# Patient Record
Sex: Female | Born: 1960 | Race: White | Hispanic: No | Marital: Married | Smoking: Never smoker
Health system: Southern US, Community
[De-identification: ages and names within clinical notes are randomized; demographics above are authoritative.]

## PROBLEM LIST (undated history)

## (undated) DIAGNOSIS — K219 Gastro-esophageal reflux disease without esophagitis: Secondary | ICD-10-CM

## (undated) HISTORY — PX: CHOLECYSTECTOMY: SHX55

---

## 2019-12-01 ENCOUNTER — Emergency Department (HOSPITAL_COMMUNITY): Payer: 59

## 2019-12-01 ENCOUNTER — Other Ambulatory Visit: Payer: Self-pay

## 2019-12-01 ENCOUNTER — Encounter (HOSPITAL_COMMUNITY): Payer: Self-pay

## 2019-12-01 ENCOUNTER — Emergency Department (HOSPITAL_COMMUNITY)
Admission: EM | Admit: 2019-12-01 | Discharge: 2019-12-01 | Disposition: A | Discharge status: Home / Self Care | Payer: 59 | Attending: Emergency Medicine | Admitting: Emergency Medicine

## 2019-12-01 DIAGNOSIS — Z79899 Other long term (current) drug therapy: Secondary | ICD-10-CM | POA: Diagnosis not present

## 2019-12-01 DIAGNOSIS — R079 Chest pain, unspecified: Secondary | ICD-10-CM

## 2019-12-01 DIAGNOSIS — R0789 Other chest pain: Secondary | ICD-10-CM | POA: Diagnosis present

## 2019-12-01 LAB — COMPREHENSIVE METABOLIC PANEL
ALT: 293 U/L — ABNORMAL HIGH (ref 0–44)
AST: 305 U/L — ABNORMAL HIGH (ref 15–41)
Albumin: 3.8 g/dL (ref 3.5–5.0)
Alkaline Phosphatase: 97 U/L (ref 38–126)
Anion gap: 9 (ref 5–15)
BUN: 9 mg/dL (ref 6–20)
CO2: 27 mmol/L (ref 22–32)
Calcium: 10.5 mg/dL — ABNORMAL HIGH (ref 8.9–10.3)
Chloride: 101 mmol/L (ref 98–111)
Creatinine, Ser: 0.92 mg/dL (ref 0.44–1.00)
GFR calc Af Amer: >60 mL/min (ref >60)
GFR calc non Af Amer: >60 mL/min (ref >60)
Glucose, Bld: 105 mg/dL — ABNORMAL HIGH (ref 70–99)
Potassium: 3.8 mmol/L (ref 3.5–5.1)
Sodium: 137 mmol/L (ref 135–145)
Total Bilirubin: 1.7 mg/dL — ABNORMAL HIGH (ref 0.3–1.2)
Total Protein: 6.9 g/dL (ref 6.5–8.1)

## 2019-12-01 LAB — CBC
HCT: 44.1 % (ref 36.0–46.0)
Hemoglobin: 14.5 g/dL (ref 12.0–15.0)
MCH: 32.7 pg (ref 26.0–34.0)
MCHC: 32.9 g/dL (ref 30.0–36.0)
MCV: 99.3 fL (ref 80.0–100.0)
Platelets: 242 10*3/uL (ref 150–400)
RBC: 4.44 MIL/uL (ref 3.87–5.11)
RDW: 12.2 % (ref 11.5–15.5)
WBC: 8.4 10*3/uL (ref 4.0–10.5)
nRBC: 0 % (ref 0.0–0.2)

## 2019-12-01 LAB — TROPONIN I (HIGH SENSITIVITY)
Troponin I (High Sensitivity): 6 ng/L (ref <18)
Troponin I (High Sensitivity): 6 ng/L (ref <18)

## 2019-12-01 LAB — LIPASE, BLOOD: Lipase: 36 U/L (ref 11–51)

## 2019-12-01 MED ORDER — ALUM & MAG HYDROXIDE-SIMETH 200-200-20 MG/5ML PO SUSP
30.0000 mL | Freq: Once | ORAL | Status: AC
  Administered 2019-12-01: 30 mL via ORAL
  Filled 2019-12-01: qty 30

## 2019-12-01 NOTE — ED Triage Notes (Signed)
Pt brought to ED via EMS from work with c/o central chest pain that woke her from sleep around midnight. Pt reports taking tums and pain eased. Pain returned today while at work. EMS admin 324 mg aspirin and 1 SL nitro for pain rated 2/10, pain unchanged. Pt reports she was dx with a sinus infection yesterday and is currently on PO abx. Currently A&O x4. Denies n/v, dizziness.

## 2019-12-01 NOTE — ED Notes (Signed)
Patient verbalizes understanding of discharge instructions. Opportunity for questioning and answers were provided. Armband removed by staff, pt discharged from ED ambulatory to home.  

## 2019-12-01 NOTE — ED Notes (Signed)
Pt transported to xray 

## 2019-12-01 NOTE — ED Provider Notes (Signed)
[facility] EMERGENCY DEPARTMENT Provider Note   CSN: [id number] Arrival date & time: 12/01/19  1143     History Chief Complaint  Patient presents with  . Chest Pain    Jean Bennett is a 59 y.o. female with PMH cholecystectomy presents the ED via EMS for central chest pain described as a "pressure" that radiated to her back that began at 10 AM.  Patient reports that she was at work and had been ambulating up and down the stairs before sitting at her desk prior to onset of symptoms.   Her chest pain lasted approximately 30 to 40 minutes before seemingly resolving.  Obtained history from EMS who states that her chest pain was 2 out of 10 on arrival and she was provided 1 nitroglycerin and 324 aspirin without improvement.  She reports that this had happened last evening at 12 AM with similar duration that woke her from her sleep and and was improved with Tums.  This episode also occurred 4 days ago, again at night.  Patient is suspecting that this is simply indigestion, however wanted to be evaluated to err on the side of caution.  She endorses some mild associated shortness of breath symptoms.  She denies any lightheadedness, fevers or chills, nausea or vomiting, syncopal episodes, neurologic deficits, palpitations, urinary symptoms, or changes in bowel habits.  She has recently been treated for a URI with antibiotics.  No pleuritic chest pain or pleuritic cough.  She is prescribed hormone replacement therapy for hot flashes, however denies any history of clots, leg swelling, clotting disorder, recent immobilization or surgery, or any other risk factors for PE.  HPI  HPI: A 59 year old patient presents for evaluation of chest pain. Initial onset of pain was approximately 3-6 hours ago. The patient's chest pain is described as heaviness/pressure/tightness and is not worse with exertion. The patient's chest pain is middle- or left-sided, is not well-localized, is not sharp and does not  radiate to the arms/jaw/neck. The patient does not complain of nausea and denies diaphoresis. The patient has no history of stroke, has no history of peripheral artery disease, has not smoked in the past 90 days, denies any history of treated diabetes, has no relevant family history of coronary artery disease (first degree relative at less than age 66), is not hypertensive, has no history of hypercholesterolemia and does not have an elevated BMI (>=30).   History reviewed. No pertinent past medical history.  There are no problems to display for this patient.   Past Surgical History:  Procedure Laterality Date  . CHOLECYSTECTOMY       OB History   No obstetric history on file.     No family history on file.  Social History   Tobacco Use  . Smoking status: Never Smoker  . Smokeless tobacco: Never Used  Substance Use Topics  . Alcohol use: Yes    Alcohol/week: 5.0 standard drinks    Types: 5 Glasses of wine per week  . Drug use: Never    Home Medications Prior to Admission medications   Medication Sig Start Date End Date Taking? Authorizing Provider  albuterol (VENTOLIN HFA) 108 (90 Base) MCG/ACT inhaler Inhale 2 puffs into the lungs every 6 (six) hours as needed for wheezing or shortness of breath.   Yes [provider]  azithromycin (ZITHROMAX) 250 MG tablet Take 250 mg by mouth daily.   Yes [provider]  progesterone (PROMETRIUM) 100 MG capsule Take 100 mg by mouth at bedtime.  10/21/19  Yes [provider]  promethazine-dextromethorphan (PROMETHAZINE-DM) 6.25-15 MG/5ML syrup Take 5 mLs by mouth 4 (four) times daily as needed for cough.   Yes [provider]    Allergies    Patient has no allergy information on record.  Review of Systems   Review of Systems  All other systems reviewed and are negative.   Physical Exam Updated Vital Signs BP 129/78   Pulse 87   Temp 98 F (36.7 C) (Oral)   Resp 20   Ht 5\' 1"  (1.549 m)   Wt  77.1 kg   SpO2 94%   BMI 32.12 kg/m   Physical Exam Vitals and nursing note reviewed. Exam conducted with a chaperone present.  Constitutional:      Appearance: Normal appearance.  HENT:     Head: Normocephalic and atraumatic.  Eyes:     General: No scleral icterus.    Conjunctiva/sclera: Conjunctivae normal.  Cardiovascular:     Rate and Rhythm: Normal rate and regular rhythm.     Pulses: Normal pulses.     Heart sounds: Normal heart sounds.  Pulmonary:     Effort: Pulmonary effort is normal. No respiratory distress.     Breath sounds: Normal breath sounds. No wheezing or rales.  Musculoskeletal:     Cervical back: Normal range of motion and neck supple. No rigidity.  Skin:    General: Skin is dry.     Capillary Refill: Capillary refill takes less than 2 seconds.  Neurological:     Mental Status: She is alert and oriented to person, place, and time.     GCS: GCS eye subscore is 4. GCS verbal subscore is 5. GCS motor subscore is 6.  Psychiatric:        Mood and Affect: Mood normal.        Behavior: Behavior normal.        Thought Content: Thought content normal.     ED Results / Procedures / Treatments   Labs (all labs ordered are listed, but only abnormal results are displayed) Labs Reviewed  COMPREHENSIVE METABOLIC PANEL - Abnormal; Notable for the following components:      Result Value   Glucose, Bld 105 (*)    Calcium 10.5 (*)    AST 305 (*)    ALT 293 (*)    Total Bilirubin 1.7 (*)    All other components within normal limits  LIPASE, BLOOD  CBC  TROPONIN I (HIGH SENSITIVITY)  TROPONIN I (HIGH SENSITIVITY)    EKG EKG Interpretation  Date/Time:  Tuesday December 01 2019 11:56:23 EST Ventricular Rate:  91 PR Interval:    QRS Duration: 96 QT Interval:  339 QTC Calculation: 417 R Axis:   64 Text Interpretation: Sinus rhythm Left atrial enlargement Confirmed by 10-27-2000 [id number]) on 12/01/2019 12:24:49 PM   Radiology DG Chest 2 View  Result Date:  12/01/2019 CLINICAL DATA:  Chest pain since Saturday, has awakened patient from sleep 3 nights in a row EXAM: CHEST - 2 VIEW COMPARISON:  11/06/2016 FINDINGS: Normal heart size, mediastinal contours, and pulmonary vascularity. Lungs clear. No pulmonary infiltrate, pleural effusion or pneumothorax. Osseous structures unremarkable. IMPRESSION: No acute abnormalities. Electronically Signed   By: 01/04/2017 M.D.   On: 12/01/2019 12:20    Procedures Procedures (including critical care time)  Medications Ordered in ED Medications  alum & mag hydroxide-simeth (MAALOX/MYLANTA) 200-200-20 MG/5ML suspension 30 mL (30 mLs Oral Given 12/01/19 1315)    ED Course  I have  reviewed the triage vital signs and the nursing notes.  Pertinent labs & imaging results that were available during my care of the patient were reviewed by me and considered in my medical decision making (see chart for details).    MDM Rules/Calculators/A&P HEAR Score: 2                    EKG demonstrates normal sinus rhythm and plain films obtained of chest demonstrate no acute normalities.  Lab work is entirely reassuring.  Her chest discomfort has resolved entirely.  While in the ED, she endorses 3 out of 10 burning chest discomfort.  Patient is treated with Maalox.  She (and her husband) believe that this is indigestion.  After receiving Maalox, she feels as though her chest pain had improved and is now a 1 out of 10.  While patient uses hormone replacement therapy, she scored 0.0 points in Wells criteria for pulmonary embolism I have low suspicion for PE at this time.  Discussed with patient and she agrees to not evaluate with CTA.  She feels much improved and is prepared for discharge as soon as we are finished with our work-up.  Per heart pathway, will obtain second troponin prior to discharge.  Second troponin returned without change.  She was found to have marked transaminitis with AST of 305 and ALT of 293 as well as a total  bilirubin of 1.7.  She is unsure whether or not this is an acute or chronic finding.  Repeat abdominal exams did not reveal any tenderness in the right upper quadrant.  Do not feel as though this is related and instead believe that it can be worked up further outpatient.  The rest of her lab work was unremarkable.  Patient is safe for discharge at this time.  All of the evaluation and work-up results were discussed with the patient and any family at bedside. They were provided opportunity to ask any additional questions and have none at this time. They have expressed understanding of verbal discharge instructions as well as return precautions and are agreeable to the plan.    Final Clinical Impression(s) / ED Diagnoses Final diagnoses:  Nonspecific chest pain    Rx / DC Orders ED Discharge Orders    None       Corena Herter, PA-C 12/01/19 1511    Virgel Manifold, MD 12/02/19 [phone]

## 2019-12-01 NOTE — Discharge Instructions (Signed)
Please follow-up with your primary care provider regarding today's encounter and to further discuss your elevated liver enzymes.    Please return to the ED or seek immediate medical attention should develop any new or worsening symptoms.

## 2019-12-04 ENCOUNTER — Telehealth: Payer: Self-pay | Admitting: *Deleted

## 2019-12-04 NOTE — Telephone Encounter (Signed)
Pt called regarding [facility] not receiving referral from ED.  RNCM faxed encounter information to [phone].

## 2021-09-12 ENCOUNTER — Other Ambulatory Visit: Payer: Self-pay

## 2021-09-12 ENCOUNTER — Inpatient Hospital Stay (HOSPITAL_COMMUNITY)
Admission: EM | Admit: 2021-09-12 | Discharge: 2021-09-16 | DRG: 446 | Disposition: A | Discharge status: Home / Self Care | Payer: 59 | Attending: Internal Medicine | Admitting: Internal Medicine

## 2021-09-12 DIAGNOSIS — K8031 Calculus of bile duct with cholangitis, unspecified, with obstruction: Principal | ICD-10-CM | POA: Diagnosis present

## 2021-09-12 DIAGNOSIS — Z419 Encounter for procedure for purposes other than remedying health state, unspecified: Secondary | ICD-10-CM

## 2021-09-12 DIAGNOSIS — K8051 Calculus of bile duct without cholangitis or cholecystitis with obstruction: Secondary | ICD-10-CM

## 2021-09-12 DIAGNOSIS — B9689 Other specified bacterial agents as the cause of diseases classified elsewhere: Secondary | ICD-10-CM

## 2021-09-12 DIAGNOSIS — K8309 Other cholangitis: Secondary | ICD-10-CM

## 2021-09-12 DIAGNOSIS — E669 Obesity, unspecified: Secondary | ICD-10-CM | POA: Diagnosis present

## 2021-09-12 DIAGNOSIS — R739 Hyperglycemia, unspecified: Secondary | ICD-10-CM | POA: Diagnosis not present

## 2021-09-12 DIAGNOSIS — F1010 Alcohol abuse, uncomplicated: Secondary | ICD-10-CM | POA: Diagnosis present

## 2021-09-12 DIAGNOSIS — Z6831 Body mass index (BMI) 31.0-31.9, adult: Secondary | ICD-10-CM

## 2021-09-12 HISTORY — DX: Gastro-esophageal reflux disease without esophagitis: K21.9

## 2021-09-13 ENCOUNTER — Emergency Department (HOSPITAL_COMMUNITY): Payer: 59

## 2021-09-13 ENCOUNTER — Other Ambulatory Visit: Payer: Self-pay

## 2021-09-13 ENCOUNTER — Encounter (HOSPITAL_COMMUNITY): Payer: Self-pay

## 2021-09-13 DIAGNOSIS — K831 Obstruction of bile duct: Secondary | ICD-10-CM | POA: Insufficient documentation

## 2021-09-13 DIAGNOSIS — F1010 Alcohol abuse, uncomplicated: Secondary | ICD-10-CM | POA: Diagnosis present

## 2021-09-13 DIAGNOSIS — R933 Abnormal findings on diagnostic imaging of other parts of digestive tract: Secondary | ICD-10-CM | POA: Diagnosis not present

## 2021-09-13 DIAGNOSIS — K8050 Calculus of bile duct without cholangitis or cholecystitis without obstruction: Secondary | ICD-10-CM | POA: Diagnosis not present

## 2021-09-13 DIAGNOSIS — K8309 Other cholangitis: Secondary | ICD-10-CM

## 2021-09-13 DIAGNOSIS — Z6831 Body mass index (BMI) 31.0-31.9, adult: Secondary | ICD-10-CM | POA: Diagnosis not present

## 2021-09-13 DIAGNOSIS — E669 Obesity, unspecified: Secondary | ICD-10-CM | POA: Diagnosis present

## 2021-09-13 DIAGNOSIS — R739 Hyperglycemia, unspecified: Secondary | ICD-10-CM | POA: Diagnosis not present

## 2021-09-13 DIAGNOSIS — K8031 Calculus of bile duct with cholangitis, unspecified, with obstruction: Secondary | ICD-10-CM | POA: Diagnosis present

## 2021-09-13 DIAGNOSIS — B9689 Other specified bacterial agents as the cause of diseases classified elsewhere: Secondary | ICD-10-CM

## 2021-09-13 LAB — CBC WITH DIFFERENTIAL/PLATELET
Abs Immature Granulocytes: 0.03 10*3/uL (ref 0.00–0.07)
Basophils Absolute: 0 10*3/uL (ref 0.0–0.1)
Basophils Relative: 0 %
Eosinophils Absolute: 0.1 10*3/uL (ref 0.0–0.5)
Eosinophils Relative: 1 %
HCT: 41.4 % (ref 36.0–46.0)
Hemoglobin: 13.9 g/dL (ref 12.0–15.0)
Immature Granulocytes: 0 %
Lymphocytes Relative: 7 %
Lymphs Abs: 0.8 10*3/uL (ref 0.7–4.0)
MCH: 32.3 pg (ref 26.0–34.0)
MCHC: 33.6 g/dL (ref 30.0–36.0)
MCV: 96.1 fL (ref 80.0–100.0)
Monocytes Absolute: 0.5 10*3/uL (ref 0.1–1.0)
Monocytes Relative: 5 %
Neutro Abs: 10.4 10*3/uL — ABNORMAL HIGH (ref 1.7–7.7)
Neutrophils Relative %: 87 %
Platelets: 273 10*3/uL (ref 150–400)
RBC: 4.31 MIL/uL (ref 3.87–5.11)
RDW: 12.1 % (ref 11.5–15.5)
WBC: 11.8 10*3/uL — ABNORMAL HIGH (ref 4.0–10.5)
nRBC: 0 % (ref 0.0–0.2)

## 2021-09-13 LAB — COMPREHENSIVE METABOLIC PANEL
ALT: 291 U/L — ABNORMAL HIGH (ref 0–44)
AST: 190 U/L — ABNORMAL HIGH (ref 15–41)
Albumin: 3.6 g/dL (ref 3.5–5.0)
Alkaline Phosphatase: 344 U/L — ABNORMAL HIGH (ref 38–126)
Anion gap: 8 (ref 5–15)
BUN: 10 mg/dL (ref 6–20)
CO2: 27 mmol/L (ref 22–32)
Calcium: 9 mg/dL (ref 8.9–10.3)
Chloride: 100 mmol/L (ref 98–111)
Creatinine, Ser: 1.04 mg/dL — ABNORMAL HIGH (ref 0.44–1.00)
GFR, Estimated: >60 mL/min (ref >60)
Glucose, Bld: 212 mg/dL — ABNORMAL HIGH (ref 70–99)
Potassium: 3.7 mmol/L (ref 3.5–5.1)
Sodium: 135 mmol/L (ref 135–145)
Total Bilirubin: 1.9 mg/dL — ABNORMAL HIGH (ref 0.3–1.2)
Total Protein: 7.4 g/dL (ref 6.5–8.1)

## 2021-09-13 LAB — TROPONIN I (HIGH SENSITIVITY)
Troponin I (High Sensitivity): 9 ng/L (ref <18)
Troponin I (High Sensitivity): 10 ng/L (ref <18)

## 2021-09-13 LAB — LIPASE, BLOOD: Lipase: 55 U/L — ABNORMAL HIGH (ref 11–51)

## 2021-09-13 MED ORDER — KETOROLAC TROMETHAMINE 30 MG/ML IJ SOLN: 30.0000 mg | Freq: Four times a day (QID) | INTRAMUSCULAR | Status: DC | PRN

## 2021-09-13 MED ORDER — THIAMINE HCL 100 MG PO TABS
100.0000 mg | ORAL_TABLET | Freq: Every day | ORAL | Status: DC
  Administered 2021-09-13 – 2021-09-14 (×2): 100 mg via ORAL
  Filled 2021-09-13 (×2): qty 1

## 2021-09-13 MED ORDER — FOLIC ACID 1 MG PO TABS
1.0000 mg | ORAL_TABLET | Freq: Every day | ORAL | Status: DC
  Administered 2021-09-13 – 2021-09-16 (×3): 1 mg via ORAL
  Filled 2021-09-13 (×3): qty 1

## 2021-09-13 MED ORDER — THIAMINE HCL 100 MG/ML IJ SOLN
100.0000 mg | Freq: Every day | INTRAMUSCULAR | Status: DC
  Administered 2021-09-16: 100 mg via INTRAVENOUS
  Filled 2021-09-13: qty 2

## 2021-09-13 MED ORDER — PANTOPRAZOLE SODIUM 40 MG PO TBEC
40.0000 mg | DELAYED_RELEASE_TABLET | Freq: Every day | ORAL | Status: DC
  Administered 2021-09-13 – 2021-09-16 (×3): 40 mg via ORAL
  Filled 2021-09-13 (×3): qty 1

## 2021-09-13 MED ORDER — ONDANSETRON HCL 4 MG PO TABS: 4.0000 mg | ORAL_TABLET | Freq: Four times a day (QID) | ORAL | Status: DC | PRN

## 2021-09-13 MED ORDER — INDOMETHACIN 50 MG RE SUPP
100.0000 mg | Freq: Once | RECTAL | Status: DC
  Filled 2021-09-13: qty 2

## 2021-09-13 MED ORDER — ACETAMINOPHEN 650 MG RE SUPP: 650.0000 mg | Freq: Four times a day (QID) | RECTAL | Status: DC | PRN

## 2021-09-13 MED ORDER — ADULT MULTIVITAMIN W/MINERALS CH
1.0000 | ORAL_TABLET | Freq: Every day | ORAL | Status: DC
  Administered 2021-09-13 – 2021-09-16 (×3): 1 via ORAL
  Filled 2021-09-13 (×3): qty 1

## 2021-09-13 MED ORDER — LORAZEPAM 1 MG PO TABS: 1.0000 mg | ORAL_TABLET | ORAL | Status: DC | PRN

## 2021-09-13 MED ORDER — ACETAMINOPHEN 325 MG PO TABS
650.0000 mg | ORAL_TABLET | Freq: Once | ORAL | Status: AC
  Administered 2021-09-13: 20:00:00 650 mg via ORAL
  Filled 2021-09-13: qty 2

## 2021-09-13 MED ORDER — PIPERACILLIN-TAZOBACTAM 3.375 G IVPB
3.3750 g | Freq: Three times a day (TID) | INTRAVENOUS | Status: DC
  Administered 2021-09-13 – 2021-09-16 (×9): 3.375 g via INTRAVENOUS
  Filled 2021-09-13 (×9): qty 50

## 2021-09-13 MED ORDER — ONDANSETRON HCL 4 MG/2ML IJ SOLN: 4.0000 mg | Freq: Four times a day (QID) | INTRAMUSCULAR | Status: DC | PRN

## 2021-09-13 MED ORDER — ACETAMINOPHEN 325 MG PO TABS
650.0000 mg | ORAL_TABLET | Freq: Four times a day (QID) | ORAL | Status: DC | PRN
  Administered 2021-09-14 (×2): 650 mg via ORAL
  Filled 2021-09-13 (×2): qty 2

## 2021-09-13 MED ORDER — LORAZEPAM 2 MG/ML IJ SOLN: 1.0000 mg | INTRAMUSCULAR | Status: DC | PRN

## 2021-09-13 MED ORDER — IOHEXOL 350 MG/ML SOLN
100.0000 mL | Freq: Once | INTRAVENOUS | Status: AC | PRN
  Administered 2021-09-13: 02:00:00 100 mL via INTRAVENOUS

## 2021-09-13 NOTE — ED Provider Notes (Signed)
[place] EMERGENCY DEPARTMENT Provider Note   CSN: [id number] Arrival date & time: 09/12/21  2346     History Chief Complaint  Patient presents with   Back Pain   Chest Pain   Abdominal Pain    Jean Bennett is a 60 y.o. female with PMH previous cholecystectomy who presents to the emergency department for evaluation of abdominal pain.  Patient states that since October 2022 she has had at least 10 episodes of acute onset right upper quadrant and epigastric pain that radiates to the back that caused her to "double over".  They are associated with nausea and vomiting and they ultimately subside.  She had an episode yesterday which prompted her to come to the emergency department last night.  Denies associated shortness of breath, diarrhea, hematochezia, chest pain with these episodes.  In the lobby there was concern about acute aortic dissection and a CT dissection study was ordered at that reassuringly did not show an aortic dissection but did show fairly severe common bile duct dilatation.  On my initial evaluation, patient symptoms have subsided.   Back Pain Associated symptoms: abdominal pain   Associated symptoms: no chest pain, no dysuria and no fever   Chest Pain Associated symptoms: abdominal pain, back pain, nausea and vomiting   Associated symptoms: no cough, no fever, no palpitations and no shortness of breath   Abdominal Pain Associated symptoms: nausea and vomiting   Associated symptoms: no chest pain, no chills, no cough, no dysuria, no fever, no hematuria, no shortness of breath and no sore throat       History reviewed. No pertinent past medical history.  There are no problems to display for this patient.   Past Surgical History:  Procedure Laterality Date   CHOLECYSTECTOMY       OB History   No obstetric history on file.     No family history on file.  Social History   Tobacco Use   Smoking status: Never   Smokeless tobacco: Never   Vaping Use   Vaping Use: Never used  Substance Use Topics   Alcohol use: Yes    Alcohol/week: 5.0 standard drinks    Types: 5 Glasses of wine per week   Drug use: Never    Home Medications Prior to Admission medications   Medication Sig Start Date End Date Taking? Authorizing Provider  Cholecalciferol (VITAMIN D3 PO) Take 1 capsule by mouth daily.   Yes [provider]  Cyanocobalamin (VITAMIN B-12 PO) Take 1 tablet by mouth daily.   Yes [provider]    Allergies    Patient has no known allergies.  Review of Systems   Review of Systems  Constitutional:  Negative for chills and fever.  HENT:  Negative for ear pain and sore throat.   Eyes:  Negative for pain and visual disturbance.  Respiratory:  Negative for cough and shortness of breath.   Cardiovascular:  Negative for chest pain and palpitations.  Gastrointestinal:  Positive for abdominal pain, nausea and vomiting.  Genitourinary:  Negative for dysuria and hematuria.  Musculoskeletal:  Positive for back pain. Negative for arthralgias.  Skin:  Negative for color change and rash.  Neurological:  Negative for seizures and syncope.  All other systems reviewed and are negative.  Physical Exam Updated Vital Signs BP 130/76    Pulse 95    Temp 98.3 F (36.8 C) (Oral)    Resp 18    Ht _0  (1.549 m)  Wt 76.2 kg    SpO2 97%    BMI 31.74 kg/m   Physical Exam Vitals and nursing note reviewed.  Constitutional:      General: She is not in acute distress.    Appearance: She is well-developed.  HENT:     Head: Normocephalic and atraumatic.  Eyes:     Conjunctiva/sclera: Conjunctivae normal.  Cardiovascular:     Rate and Rhythm: Normal rate and regular rhythm.     Heart sounds: No murmur heard. Pulmonary:     Effort: Pulmonary effort is normal. No respiratory distress.     Breath sounds: Normal breath sounds.  Abdominal:     Palpations: Abdomen is soft.     Tenderness: There is no abdominal  tenderness.  Musculoskeletal:        General: No swelling.     Cervical back: Neck supple.  Skin:    General: Skin is warm and dry.     Capillary Refill: Capillary refill takes less than 2 seconds.  Neurological:     Mental Status: She is alert.  Psychiatric:        Mood and Affect: Mood normal.    ED Results / Procedures / Treatments   Labs (all labs ordered are listed, but only abnormal results are displayed) Labs Reviewed  CBC WITH DIFFERENTIAL/PLATELET - Abnormal; Notable for the following components:      Result Value   WBC 11.8 (*)    Neutro Abs 10.4 (*)    All other components within normal limits  COMPREHENSIVE METABOLIC PANEL - Abnormal; Notable for the following components:   Glucose, Bld 212 (*)    Creatinine, Ser 1.04 (*)    AST 190 (*)    ALT 291 (*)    Alkaline Phosphatase 344 (*)    Total Bilirubin 1.9 (*)    All other components within normal limits  LIPASE, BLOOD - Abnormal; Notable for the following components:   Lipase 55 (*)    All other components within normal limits  TROPONIN I (HIGH SENSITIVITY)  TROPONIN I (HIGH SENSITIVITY)    EKG EKG Interpretation  Date/Time:  Tuesday September 12 2021 23:55:49 EST Ventricular Rate:  96 PR Interval:  134 QRS Duration: 88 QT Interval:  350 QTC Calculation: 442 R Axis:   93 Text Interpretation: Normal sinus rhythm Rightward axis Borderline ECG No significant change since last tracing Confirmed by Isla Pence [phone]) on 09/13/2021 1:25:14 PM  Radiology DG Chest 2 View  Result Date: 09/13/2021 CLINICAL DATA:  Chest pain EXAM: CHEST - 2 VIEW COMPARISON:  12/01/2019 FINDINGS: Lungs are clear.  No pleural effusion or pneumothorax. The heart is normal in size. Visualized osseous structures are within normal limits. Cholecystectomy clips. IMPRESSION: Normal chest radiographs. Electronically Signed   By: Julian Hy M.D.   On: 09/13/2021 01:05   CT Angio Chest/Abd/Pel for Dissection W and/or Wo  Contrast  Result Date: 09/13/2021 CLINICAL DATA:  Chest and back pain.  Concern for aortic dissection. EXAM: CT ANGIOGRAPHY CHEST, ABDOMEN AND PELVIS TECHNIQUE: Non-contrast CT of the chest was initially obtained. Multidetector CT imaging through the chest, abdomen and pelvis was performed using the standard protocol during bolus administration of intravenous contrast. Multiplanar reconstructed images and MIPs were obtained and reviewed to evaluate the vascular anatomy. CONTRAST:  166m OMNIPAQUE IOHEXOL 350 MG/ML SOLN COMPARISON:  CT of the abdomen pelvis dated 08/29/2013. Chest radiograph dated 09/13/2021. FINDINGS: CTA CHEST FINDINGS Cardiovascular: There is no cardiomegaly or pericardial effusion the thoracic aorta is unremarkable.  The origins of the great vessels of the aortic arch appear patent. Evaluation of the pulmonary arteries is limited due to respiratory motion artifact. No pulmonary artery embolus item Mediastinum/Nodes: No hilar or mediastinal adenopathy. The esophagus is grossly unremarkable. No mediastinal fluid collection. Lungs/Pleura: Minimal bibasilar atelectasis. No focal consolidation, pleural effusion, or pneumothorax. The central airways are patent. Musculoskeletal: Bilateral breast implants. No acute osseous pathology. Review of the MIP images confirms the above findings. CTA ABDOMEN AND PELVIS FINDINGS VASCULAR Aorta: Normal caliber aorta without aneurysm, dissection, vasculitis or significant stenosis. Celiac: Patent without evidence of aneurysm, dissection, vasculitis or significant stenosis. SMA: Patent without evidence of aneurysm, dissection, vasculitis or significant stenosis. Renals: Both renal arteries are patent without evidence of aneurysm, dissection, vasculitis, fibromuscular dysplasia or significant stenosis. IMA: Patent without evidence of aneurysm, dissection, vasculitis or significant stenosis. Inflow: Patent without evidence of aneurysm, dissection, vasculitis or  significant stenosis. Veins: No obvious venous abnormality within the limitations of this arterial phase study. Review of the MIP images confirms the above findings. NON-VASCULAR No intra-abdominal free air or free fluid. Hepatobiliary: Slight heterogeneous enhancement of the liver. There is a 2 cm left hepatic cyst as well as smaller additional cysts, present on the prior CT but slightly increased in size since 2014. There is dilatation of the common bile duct and intrahepatic biliary trees, likely post cholecystectomy. The common bile duct measures approximately 17 mm in diameter. No retained calcified stone noted in the central CBD. There is however faint soft tissue density in the region of the MP of (167/6 and coronal 70/9) which may represent ampullary tissue or debris. This can be better evaluated with MRI/MRCP, possibly on a nonemergent/outpatient basis. Pancreas: Unremarkable. No pancreatic ductal dilatation or surrounding inflammatory changes. Spleen: Normal in size without focal abnormality. Adrenals/Urinary Tract: The adrenal glands unremarkable. The kidneys, visualized ureters, and urinary bladder appear unremarkable. Stomach/Bowel: Small scattered sigmoid diverticula without active inflammatory changes. There is no bowel obstruction or active inflammation. The appendix is normal. Lymphatic: No adenopathy. Reproductive: The uterus and ovaries are grossly unremarkable. No adnexal masses. Other: None Musculoskeletal: No acute or significant osseous findings. Review of the MIP images confirms the above findings. IMPRESSION: 1. No acute intrathoracic, abdominal, or pelvic pathology. No aortic aneurysm or dissection. 2. Cholecystectomy with dilatation of the common bile duct and intrahepatic biliary trees. MRI/MRCP may provide better evaluation, possibly on a nonemergent/outpatient basis. 3. Small scattered sigmoid diverticula without active inflammatory changes. No bowel obstruction. Normal appendix.  Electronically Signed   By: Anner Crete M.D.   On: 09/13/2021 01:52   US Abdomen Limited RUQ (LIVER/GB)  Result Date: 09/13/2021 CLINICAL DATA:  Common bile duct dilatation on CT. EXAM: ULTRASOUND ABDOMEN LIMITED RIGHT UPPER QUADRANT COMPARISON:  CT 09/13/2021 FINDINGS: Gallbladder: Postcholecystectomy. Common bile duct: Diameter: Dilated 9 mm. Liver: Mild intrahepatic biliary duct dilatation similar to comparison CT. Several cystic lesions in liver. Mild increased liver echogenicity portal vein is patent on color Doppler imaging with normal direction of blood flow towards the liver. Other: None. IMPRESSION: 1. Dilatation of the common bile duct as seen on comparison CT. 2. Mild intrahepatic biliary duct dilatation seen on comparison CT. 3. Mild increased liver echogenicity. Electronically Signed   By: Suzy Bouchard M.D.   On: 09/13/2021 13:03    Procedures Procedures   Medications Ordered in ED Medications  piperacillin-tazobactam (ZOSYN) IVPB 3.375 g (3.375 g Intravenous New Bag/Given 09/13/21 1455)  iohexol (OMNIPAQUE) 350 MG/ML injection 100 mL (100 mLs Intravenous Contrast Given  09/13/21 0138)    ED Course  I have reviewed the triage vital signs and the nursing notes.  Pertinent labs & imaging results that were available during my care of the patient were reviewed by me and considered in my medical decision making (see chart for details).    MDM Rules/Calculators/A&P                           Patient seen the emergency department for evaluation of abdominal pain.  Physical exam reveals tenderness in the epigastrium and in the right upper quadrant but no abdominal guarding.  Patient not complaining of abdominal pain at rest.  Laboratory evaluation with an AST elevation to 190, ALT 291, alk phos elevated to 344 and total bili elevated at 1.9, leukocytosis to 11.8 concerning for biliary obstruction.  With the patient's lobby obtained CT dissection study showing a common bile duct  dilatation, concern for choledocholithiasis, possible impending cholangitis.  Right upper quadrant ultrasound showing biliary dilatation.  GI consulted and recommendations are currently pending.  Patient then signed out to oncoming provider.  Please see provider signout for continuation of work-up. Final Clinical Impression(s) / ED Diagnoses Final diagnoses:  Choledocholithiasis with obstruction    Rx / DC Orders ED Discharge Orders     None        Zigmond Trela, Debe Coder, MD 09/13/21 [phone]

## 2021-09-13 NOTE — Progress Notes (Signed)
Pharmacy Antibiotic Note  Jean Bennett is a 60 y.o. female admitted on 09/12/2021 with  IAI .  Pharmacy has been consulted for Zosyn dosing.  Plan: Zosyn 3.375g IV q8h (4 hour infusion). -Monitor renal function, clinical status, and antibiotic plan  Height: 5\' 1"  (154.9 cm) Weight: 76.2 kg (168 lb) IBW/kg (Calculated) : 47.8  Temp (24hrs), Avg:98.6 F (37 C), Min:98.3 F (36.8 C), Max:98.9 F (37.2 C)  Recent Labs  Lab 09/13/21 0012  WBC 11.8*  CREATININE 1.04*    Estimated Creatinine Clearance: 55.1 mL/min (A) (by C-G formula based on SCr of 1.04 mg/dL (H)).    No Known Allergies  Antimicrobials this admission: Zosyn 12/14 >>   Thank you for allowing pharmacy to be a part of this patients care.  1/15, PharmD, [facility] Emergency Medicine Clinical Pharmacist ED RPh Phone: [phone] Main RX: [phone]

## 2021-09-13 NOTE — ED Triage Notes (Signed)
Pt here for generalized pain that's been ongoing for the past 4 hours. Pt stated that she can't move and states that pain started around 1900. Pt states that she's been taking antacids and tums for pain relief. Pt thought she had indigestion hence her reasoning for taking the antacids for pain.

## 2021-09-13 NOTE — ED Provider Notes (Signed)
Emergency Medicine Provider Triage Evaluation Note  Jean Bennett , a 60 y.o. female  was evaluated in triage.  Pt complains of back pain which radiates into her chest and abdomen.  Began about 4 hours PTA.  Described as sharp, stabbing.  No history of AAA, dissection. Initially thought she was having reflux however symptoms have persisted and are severe. Took antacids without relief. She rates her pain a 10/10.  Did note some darker than normal stools.  No history of PE, DVT, anticoagulation  Review of Systems  Positive: Back pain, CP, ABD pain Negative:   Physical Exam  BP (!) 111/94    Pulse 99    Temp 98.5 F (36.9 C)    Resp 17    Ht 5\' 1"  (1.549 m)    Wt 76.2 kg    SpO2 100%    BMI 31.74 kg/m  Gen:   Awake, tossing in pain Resp:  Normal effort  ABD:  Severe abd tenderness throughout upper abdomen MSK:   Moves extremities without difficulty  Other:    Medical Decision Making  Medically screening exam initiated at 12:46 AM.  Appropriate orders placed.  Jean Bennett was informed that the remainder of the evaluation will be completed by another provider, this initial triage assessment does not replace that evaluation, and the importance of remaining in the ED until their evaluation is complete.  Back pain, chest pain, abdominal pain   Ajdin Macke A, PA-C 09/13/21 0046    09/15/21, MD 09/13/21 [phone]

## 2021-09-13 NOTE — Consult Note (Signed)
[facility] Consult: 1:26 PM 09/13/2021  LOS: 0 days    Referring Provider: Dr Cline Cools  Primary Care Physician:  Endocrinology, [facility]ologist:  [facility] in [place].      Reason for Consultation: Abdominal pain, elevated LFTs.   HPI: Jean Bennett is a 60 y.o. female.  PMH cholecystectomy ~age 63.  Within a month of the cholecystectomy she developed acute jaundice and underwent ERCP.  Breast implants.  Hysteroscopy, D&C.  Tinnitus.  Colonoscopy age 22, average risk study.  Due for repeat screening age 89.  Seen in ED 11/2019 for sub sternal chest pressure, radiating to back, elevated LFTs noted: t bili 1.7, alk phos 97, AST/ALT 305/293, no [place], CT or MRI imaging then.  Only listed meds are vitamin D and B12 (RXd for tinnitus).  In October 2022 pt had a spell of pain in epigastrium radiating to back.  It subsided within 24 hours and was not associated with nausea, vomiting or residual symptoms.  Starting this past Friday, 5 days ago, she developed intense pain in the epigastrium radiating to the right upper abdomen and into her back.  She tried taking Tums and "acid reducer" med to no avail.  No associated nausea, vomiting or altered bowel habits.  Appetite was good and she is tolerating food.  In the last few days she has had some chills but no sweats.  Came to the ED today for evaluation when she was not able to get an appointment with her PCP for several weeks.  T bili 1.9.  Alkaline phosphatase 344.  AST/ALT 190/291.  Lipase 55.  Calcium 9.  BUN/creatinine 10/1.0.  WBCs 11.8.  Hgb 13.9.  Platelets 273 CT angio chest/abdomen/pelvis: Slight heterogeneity of liver, 2 cm left hepatic cyst and smaller additional liver cysts, slightly increased compared to 2014.  CBD dilated at  17 mm there is a faint soft tissue density in the region of the MP but may represent ampullary tissue or debris.  Intrahepatic biliary tree also mildly dilated.  Scattered sigmoid tics.  Pancreas, pancreatic duct unremarkable. Abdominal ultrasound : CBD 9 mm.  Mild intrahepatic biliary ductal dilatation, mild increased liver echo.  Dopplers normal.  Zosyn initiated.     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  History reviewed. No pertinent past medical history.  Past Surgical History:  Procedure Laterality Date   CHOLECYSTECTOMY      Prior to Admission medications   Medication Sig Start Date End Date Taking? Authorizing Provider  Cholecalciferol (VITAMIN D3 PO) Take 1 capsule by mouth daily.   Yes [provider]  Cyanocobalamin (VITAMIN B-12 PO) Take 1 tablet by mouth daily.   Yes [provider]    Scheduled Meds:  Infusions:  PRN Meds:    Allergies as of 09/12/2021   (Not on File)    No family history on file.  Social History   Socioeconomic History   Marital status: Married    Spouse name: Not on file   Number  of children: Not on file   Years of education: Not on file   Highest education level: Not on file  Occupational History   Not on file  Tobacco Use   Smoking status: Never   Smokeless tobacco: Never  Vaping Use   Vaping Use: Never used  Substance and Sexual Activity   Alcohol use: Yes    Alcohol/week: 5.0 standard drinks    Types: 5 Glasses of wine per week   Drug use: Never   Sexual activity: Not on file  Other Topics Concern   Not on file  Social History Narrative   Not on file   Social Determinants of Health   Financial Resource Strain: Not on file  Food Insecurity: Not on file  Transportation Needs: Not on file  Physical Activity: Not on file  Stress: Not on file  Social Connections: Not on file  Intimate Partner Violence: Not on file    REVIEW OF  SYSTEMS: Constitutional: Generally no lack of energy. ENT:  No nose bleeds.  + Tinnitus Pulm: She does not feel short of breath but at times, when she takes a deep breath it exacerbates the pain. CV:  No palpitations, no LE edema.  No angina. GU:  No hematuria, no frequency.  No dark urine, no dysuria. GI: See HPI.  Normally has no GI complaints. Heme: Denies unusual or excessive bleeding or bruising. Transfusions: None. Neuro:  No headaches, no peripheral tingling or numbness Derm:  No itching, no rash or sores.  Endocrine:  No sweats or chills.  No polyuria or dysuria Immunization: Vaccinated for COVID-19, has not yet received her flu vaccine for this season. Travel:  None beyond local counties in last few months.    PHYSICAL EXAM: Vital signs in last 24 hours: Vitals:   09/13/21 1215 09/13/21 1251  BP: 121/71 117/69  Pulse: 100 98  Resp: 20 (!) 23  Temp:    SpO2: 96% 96%   Wt Readings from Last 3 Encounters:  09/13/21 76.2 kg  12/01/19 77.1 kg    General: Pleasant, comfortable, well-appearing. Head: No facial asymmetry or swelling.  No signs of head trauma. Eyes: No scleral icterus.  No conjunctival pallor. Ears: Not hard of hearing Nose: No congestion or discharge Mouth: Good dentition.  Tongue midline.  Mucosa moist, pink, clear. Neck: No JVD, no masses Lungs: Clear bilaterally without labored breathing or cough. Heart: RRR.  No MRG.  S1, S2 present. Abdomen: Not distended.  Bowel sounds active.  Tenderness without guarding or rebound in the epigastrium and right upper quadrant..   Rectal: Deferred. Musc/Skeltl: No obvious joint deformities or swelling. Extremities: No CCE. Neurologic: Alert.  Oriented x3.  Good historian.  Moves all 4 limbs without tremor, strength not tested. Skin: No jaundice, no telangiectasia, no significant purpura/bruising. Nodes: No cervical adenopathy Psych: Cooperative, calm, pleasant.  Intake/Output from previous day: No intake/output  data recorded. Intake/Output this shift: No intake/output data recorded.  LAB RESULTS: Recent Labs    09/13/21 0012  WBC 11.8*  HGB 13.9  HCT 41.4  PLT 273   BMET Lab Results  Component Value Date   NA 135 09/13/2021   NA 137 12/01/2019   K 3.7 09/13/2021   K 3.8 12/01/2019   CL 100 09/13/2021   CL 101 12/01/2019   CO2 27 09/13/2021   CO2 27 12/01/2019   GLUCOSE 212 (H) 09/13/2021   GLUCOSE 105 (H) 12/01/2019   BUN 10 09/13/2021   BUN 9 12/01/2019   CREATININE  1.04 (H) 09/13/2021   CREATININE 0.92 12/01/2019   CALCIUM 9.0 09/13/2021   CALCIUM 10.5 (H) 12/01/2019   LFT Recent Labs    09/13/21 0012  PROT 7.4  ALBUMIN 3.6  AST 190*  ALT 291*  ALKPHOS 344*  BILITOT 1.9*   PT/INR No results found for: INR, PROTIME Hepatitis Panel No results for input(s): HEPBSAG, HCVAB, HEPAIGM, HEPBIGM in the last 72 hours. C-Diff No components found for: CDIFF Lipase     Component Value Date/Time   LIPASE 55 (H) 09/13/2021 0012    Drugs of Abuse  No results found for: LABOPIA, COCAINSCRNUR, LABBENZ, AMPHETMU, THCU, LABBARB   RADIOLOGY STUDIES: DG Chest 2 View  Result Date: 09/13/2021 CLINICAL DATA:  Chest pain EXAM: CHEST - 2 VIEW COMPARISON:  12/01/2019 FINDINGS: Lungs are clear.  No pleural effusion or pneumothorax. The heart is normal in size. Visualized osseous structures are within normal limits. Cholecystectomy clips. IMPRESSION: Normal chest radiographs. Electronically Signed   By: Julian Hy M.D.   On: 09/13/2021 01:05   CT Angio Chest/Abd/Pel for Dissection W and/or Wo Contrast  Result Date: 09/13/2021 CLINICAL DATA:  Chest and back pain.  Concern for aortic dissection. EXAM: CT ANGIOGRAPHY CHEST, ABDOMEN AND PELVIS TECHNIQUE: Non-contrast CT of the chest was initially obtained. Multidetector CT imaging through the chest, abdomen and pelvis was performed using the standard protocol during bolus administration of intravenous contrast. Multiplanar  reconstructed images and MIPs were obtained and reviewed to evaluate the vascular anatomy. CONTRAST:  156m OMNIPAQUE IOHEXOL 350 MG/ML SOLN COMPARISON:  CT of the abdomen pelvis dated 08/29/2013. Chest radiograph dated 09/13/2021. FINDINGS: CTA CHEST FINDINGS Cardiovascular: There is no cardiomegaly or pericardial effusion the thoracic aorta is unremarkable. The origins of the great vessels of the aortic arch appear patent. Evaluation of the pulmonary arteries is limited due to respiratory motion artifact. No pulmonary artery embolus item Mediastinum/Nodes: No hilar or mediastinal adenopathy. The esophagus is grossly unremarkable. No mediastinal fluid collection. Lungs/Pleura: Minimal bibasilar atelectasis. No focal consolidation, pleural effusion, or pneumothorax. The central airways are patent. Musculoskeletal: Bilateral breast implants. No acute osseous pathology. Review of the MIP images confirms the above findings. CTA ABDOMEN AND PELVIS FINDINGS VASCULAR Aorta: Normal caliber aorta without aneurysm, dissection, vasculitis or significant stenosis. Celiac: Patent without evidence of aneurysm, dissection, vasculitis or significant stenosis. SMA: Patent without evidence of aneurysm, dissection, vasculitis or significant stenosis. Renals: Both renal arteries are patent without evidence of aneurysm, dissection, vasculitis, fibromuscular dysplasia or significant stenosis. IMA: Patent without evidence of aneurysm, dissection, vasculitis or significant stenosis. Inflow: Patent without evidence of aneurysm, dissection, vasculitis or significant stenosis. Veins: No obvious venous abnormality within the limitations of this arterial phase study. Review of the MIP images confirms the above findings. NON-VASCULAR No intra-abdominal free air or free fluid. Hepatobiliary: Slight heterogeneous enhancement of the liver. There is a 2 cm left hepatic cyst as well as smaller additional cysts, present on the prior CT but slightly  increased in size since 2014. There is dilatation of the common bile duct and intrahepatic biliary trees, likely post cholecystectomy. The common bile duct measures approximately 17 mm in diameter. No retained calcified stone noted in the central CBD. There is however faint soft tissue density in the region of the MP of (167/6 and coronal 70/9) which may represent ampullary tissue or debris. This can be better evaluated with MRI/MRCP, possibly on a nonemergent/outpatient basis. Pancreas: Unremarkable. No pancreatic ductal dilatation or surrounding inflammatory changes. Spleen: Normal in size without focal  abnormality. Adrenals/Urinary Tract: The adrenal glands unremarkable. The kidneys, visualized ureters, and urinary bladder appear unremarkable. Stomach/Bowel: Small scattered sigmoid diverticula without active inflammatory changes. There is no bowel obstruction or active inflammation. The appendix is normal. Lymphatic: No adenopathy. Reproductive: The uterus and ovaries are grossly unremarkable. No adnexal masses. Other: None Musculoskeletal: No acute or significant osseous findings. Review of the MIP images confirms the above findings. IMPRESSION: 1. No acute intrathoracic, abdominal, or pelvic pathology. No aortic aneurysm or dissection. 2. Cholecystectomy with dilatation of the common bile duct and intrahepatic biliary trees. MRI/MRCP may provide better evaluation, possibly on a nonemergent/outpatient basis. 3. Small scattered sigmoid diverticula without active inflammatory changes. No bowel obstruction. Normal appendix. Electronically Signed   By: Anner Crete M.D.   On: 09/13/2021 01:52   US Abdomen Limited RUQ (LIVER/GB)  Result Date: 09/13/2021 CLINICAL DATA:  Common bile duct dilatation on CT. EXAM: ULTRASOUND ABDOMEN LIMITED RIGHT UPPER QUADRANT COMPARISON:  CT 09/13/2021 FINDINGS: Gallbladder: Postcholecystectomy. Common bile duct: Diameter: Dilated 9 mm. Liver: Mild intrahepatic biliary duct  dilatation similar to comparison CT. Several cystic lesions in liver. Mild increased liver echogenicity portal vein is patent on color Doppler imaging with normal direction of blood flow towards the liver. Other: None. IMPRESSION: 1. Dilatation of the common bile duct as seen on comparison CT. 2. Mild intrahepatic biliary duct dilatation seen on comparison CT. 3. Mild increased liver echogenicity. Electronically Signed   By: Suzy Bouchard M.D.   On: 09/13/2021 13:03     IMPRESSION:   Pain right upper quadrant/epigastric radiating to back.   Imaging thus far CT and ultrasound confirm mild intrahepatic biliary ductal dilatation as well as dilated CBD along with density at the ampulla which may be ampullary tissue itself versus stone/debris.    Hyperglycemia.  No hx DM.       PLAN:        ERCP, set for Friday at 1PM. Ordered [place] diet, can advance if tolerated.   Cmet in AM   Azucena Freed  09/13/2021, 1:26 PM Phone [phone]

## 2021-09-13 NOTE — H&P (Signed)
History and Physical    Jean Bennett [id number] DOB: 11-16-1960 DOA: 09/12/2021  PCP: Endocrinology, [facility] (Confirm with patient/family/NH records and if not entered, this has to be entered at [facility] point of entry) Patient coming from: Home  I have personally briefly reviewed patient's old medical records in [place]  Chief Complaint: Belly hurts  HPI: Jean Bennett is a 60 y.o. female with medical history significant of obesity, presented with worsening of recurrent abdominal pain.  She has had on and off epigastric pain for 1 week, usually happens after eating a big meal, which she considered to be " indigestion".  The pain usually described as located epigastrically, radiated around the rib cage to the back, cramping-like, can be as severe as 7-8/10, associated with nausea but no vomiting.  She usually took TUMs with some success and pain usually went away in about 1 hour.  Yesterday however, the pain came more frequently, and last night, the pain stayed constant along with episode of chills but no fever.  No diarrhea no vomiting no chest pain shortness of breath. At baseline, she drinks regularly about 1 beer per day, but denied any binge drinking, no history of withdrawal.  She said last year, she had a similar episode but not severe pain, when she came to the hospital, discharged from ER and recommended follow-up with outpatient GI, which she did not do for the reason that the pain never came back again last year.  ED Course: Borderline tachycardia, no hypotension.  Afebrile, blood work showed elevated liver enzymes, AST 190, ALT 291.  WBC 11.8.  Troponin negative x2, EKG no acute ST changes.  CTA today for dissection, positive signs of intrahepatocellular bile duct dilation.  None active diverticulosis.  Patient was started on IV Zosyn.  Review of Systems: As per HPI otherwise 14 point review of systems negative.    History reviewed. No pertinent past medical  history.  Past Surgical History:  Procedure Laterality Date   CHOLECYSTECTOMY       reports that she has never smoked. She has never used smokeless tobacco. She reports current alcohol use of about 5.0 standard drinks per week. She reports that she does not use drugs.  No Known Allergies  No family history on file.   Prior to Admission medications   Medication Sig Start Date End Date Taking? Authorizing Provider  Cholecalciferol (VITAMIN D3 PO) Take 1 capsule by mouth daily.   Yes [provider]  Cyanocobalamin (VITAMIN B-12 PO) Take 1 tablet by mouth daily.   Yes [provider]    Physical Exam: Vitals:   09/13/21 1545 09/13/21 1600 09/13/21 1615 09/13/21 1645  BP: 125/75 123/77 128/77 111/61  Pulse: 91 89 89 98  Resp: (!) 21 18 (!) 21 (!) 22  Temp:      TempSrc:      SpO2: 95% 95% 95% 96%  Weight:      Height:        Constitutional: NAD, calm, comfortable Vitals:   09/13/21 1545 09/13/21 1600 09/13/21 1615 09/13/21 1645  BP: 125/75 123/77 128/77 111/61  Pulse: 91 89 89 98  Resp: (!) 21 18 (!) 21 (!) 22  Temp:      TempSrc:      SpO2: 95% 95% 95% 96%  Weight:      Height:       Eyes: PERRL, lids and conjunctivae normal ENMT: Mucous membranes are moist. Posterior pharynx clear of any exudate or lesions.Normal dentition.  Neck:  normal, supple, no masses, no thyromegaly Respiratory: clear to auscultation bilaterally, no wheezing, no crackles. Normal respiratory effort. No accessory muscle use.  Cardiovascular: Regular rate and rhythm, no murmurs / rubs / gallops. No extremity edema. 2+ pedal pulses. No carotid bruits.  Abdomen: tenderness on the epigastric area, no rebound no guarding, no masses palpated. No hepatosplenomegaly. Bowel sounds positive.  Musculoskeletal: no clubbing / cyanosis. No joint deformity upper and lower extremities. Good ROM, no contractures. Normal muscle tone.  Skin: no rashes, lesions, ulcers. No induration Neurologic:  CN 2-12 grossly intact. Sensation intact, DTR normal. Strength 5/5 in all 4.  Psychiatric: Normal judgment and insight. Alert and oriented x 3. Normal mood.     Labs on Admission: I have personally reviewed following labs and imaging studies  CBC: Recent Labs  Lab 09/13/21 0012  WBC 11.8*  NEUTROABS 10.4*  HGB 13.9  HCT 41.4  MCV 96.1  PLT 273   Basic Metabolic Panel: Recent Labs  Lab 09/13/21 0012  NA 135  K 3.7  CL 100  CO2 27  GLUCOSE 212*  BUN 10  CREATININE 1.04*  CALCIUM 9.0   GFR: Estimated Creatinine Clearance: 55.1 mL/min (A) (by C-G formula based on SCr of 1.04 mg/dL (H)). Liver Function Tests: Recent Labs  Lab 09/13/21 0012  AST 190*  ALT 291*  ALKPHOS 344*  BILITOT 1.9*  PROT 7.4  ALBUMIN 3.6   Recent Labs  Lab 09/13/21 0012  LIPASE 55*   No results for input(s): AMMONIA in the last 168 hours. Coagulation Profile: No results for input(s): INR, PROTIME in the last 168 hours. Cardiac Enzymes: No results for input(s): CKTOTAL, CKMB, CKMBINDEX, TROPONINI in the last 168 hours. BNP (last 3 results) No results for input(s): PROBNP in the last 8760 hours. HbA1C: No results for input(s): HGBA1C in the last 72 hours. CBG: No results for input(s): GLUCAP in the last 168 hours. Lipid Profile: No results for input(s): CHOL, HDL, LDLCALC, TRIG, CHOLHDL, LDLDIRECT in the last 72 hours. Thyroid Function Tests: No results for input(s): TSH, T4TOTAL, FREET4, T3FREE, THYROIDAB in the last 72 hours. Anemia Panel: No results for input(s): VITAMINB12, FOLATE, FERRITIN, TIBC, IRON, RETICCTPCT in the last 72 hours. Urine analysis: No results found for: COLORURINE, APPEARANCEUR, LABSPEC, PHURINE, GLUCOSEU, HGBUR, BILIRUBINUR, KETONESUR, PROTEINUR, UROBILINOGEN, NITRITE, LEUKOCYTESUR  Radiological Exams on Admission: DG Chest 2 View  Result Date: 09/13/2021 CLINICAL DATA:  Chest pain EXAM: CHEST - 2 VIEW COMPARISON:  12/01/2019 FINDINGS: Lungs are clear.   No pleural effusion or pneumothorax. The heart is normal in size. Visualized osseous structures are within normal limits. Cholecystectomy clips. IMPRESSION: Normal chest radiographs. Electronically Signed   By: Charline Bills M.D.   On: 09/13/2021 01:05   CT Angio Chest/Abd/Pel for Dissection W and/or Wo Contrast  Result Date: 09/13/2021 CLINICAL DATA:  Chest and back pain.  Concern for aortic dissection. EXAM: CT ANGIOGRAPHY CHEST, ABDOMEN AND PELVIS TECHNIQUE: Non-contrast CT of the chest was initially obtained. Multidetector CT imaging through the chest, abdomen and pelvis was performed using the standard protocol during bolus administration of intravenous contrast. Multiplanar reconstructed images and MIPs were obtained and reviewed to evaluate the vascular anatomy. CONTRAST:  OMNIPAQUE IOHEXOL 350 MG/ML SOLN COMPARISON:  CT of the abdomen pelvis dated 08/29/2013. Chest radiograph dated 09/13/2021. FINDINGS: CTA CHEST FINDINGS Cardiovascular: There is no cardiomegaly or pericardial effusion the thoracic aorta is unremarkable. The origins of the great vessels of the aortic arch appear patent. Evaluation of the pulmonary arteries  is limited due to respiratory motion artifact. No pulmonary artery embolus item Mediastinum/Nodes: No hilar or mediastinal adenopathy. The esophagus is grossly unremarkable. No mediastinal fluid collection. Lungs/Pleura: Minimal bibasilar atelectasis. No focal consolidation, pleural effusion, or pneumothorax. The central airways are patent. Musculoskeletal: Bilateral breast implants. No acute osseous pathology. Review of the MIP images confirms the above findings. CTA ABDOMEN AND PELVIS FINDINGS VASCULAR Aorta: Normal caliber aorta without aneurysm, dissection, vasculitis or significant stenosis. Celiac: Patent without evidence of aneurysm, dissection, vasculitis or significant stenosis. SMA: Patent without evidence of aneurysm, dissection, vasculitis or significant  stenosis. Renals: Both renal arteries are patent without evidence of aneurysm, dissection, vasculitis, fibromuscular dysplasia or significant stenosis. IMA: Patent without evidence of aneurysm, dissection, vasculitis or significant stenosis. Inflow: Patent without evidence of aneurysm, dissection, vasculitis or significant stenosis. Veins: No obvious venous abnormality within the limitations of this arterial phase study. Review of the MIP images confirms the above findings. NON-VASCULAR No intra-abdominal free air or free fluid. Hepatobiliary: Slight heterogeneous enhancement of the liver. There is a 2 cm left hepatic cyst as well as smaller additional cysts, present on the prior CT but slightly increased in size since 2014. There is dilatation of the common bile duct and intrahepatic biliary trees, likely post cholecystectomy. The common bile duct measures approximately 17 mm in diameter. No retained calcified stone noted in the central CBD. There is however faint soft tissue density in the region of the MP of (167/6 and coronal 70/9) which may represent ampullary tissue or debris. This can be better evaluated with MRI/MRCP, possibly on a nonemergent/outpatient basis. Pancreas: Unremarkable. No pancreatic ductal dilatation or surrounding inflammatory changes. Spleen: Normal in size without focal abnormality. Adrenals/Urinary Tract: The adrenal glands unremarkable. The kidneys, visualized ureters, and urinary bladder appear unremarkable. Stomach/Bowel: Small scattered sigmoid diverticula without active inflammatory changes. There is no bowel obstruction or active inflammation. The appendix is normal. Lymphatic: No adenopathy. Reproductive: The uterus and ovaries are grossly unremarkable. No adnexal masses. Other: None Musculoskeletal: No acute or significant osseous findings. Review of the MIP images confirms the above findings. IMPRESSION: 1. No acute intrathoracic, abdominal, or pelvic pathology. No aortic  aneurysm or dissection. 2. Cholecystectomy with dilatation of the common bile duct and intrahepatic biliary trees. MRI/MRCP may provide better evaluation, possibly on a nonemergent/outpatient basis. 3. Small scattered sigmoid diverticula without active inflammatory changes. No bowel obstruction. Normal appendix. Electronically Signed   By: Anner Crete M.D.   On: 09/13/2021 01:52   US Abdomen Limited RUQ (LIVER/GB)  Result Date: 09/13/2021 CLINICAL DATA:  Common bile duct dilatation on CT. EXAM: ULTRASOUND ABDOMEN LIMITED RIGHT UPPER QUADRANT COMPARISON:  CT 09/13/2021 FINDINGS: Gallbladder: Postcholecystectomy. Common bile duct: Diameter: Dilated 9 mm. Liver: Mild intrahepatic biliary duct dilatation similar to comparison CT. Several cystic lesions in liver. Mild increased liver echogenicity portal vein is patent on color Doppler imaging with normal direction of blood flow towards the liver. Other: None. IMPRESSION: 1. Dilatation of the common bile duct as seen on comparison CT. 2. Mild intrahepatic biliary duct dilatation seen on comparison CT. 3. Mild increased liver echogenicity. Electronically Signed   By: Suzy Bouchard M.D.   On: 09/13/2021 13:03    EKG: Independently reviewed.  Sinus, no acute ST changes  Assessment/Plan Principal Problem:   Cholangitis Active Problems:   Bacterial cholangitis  (please populate well all problems here in Problem List. (For example, if patient is on BP meds at home and you resume or decide to hold them, it is  a problem that needs to be her. Same for CAD, COPD, HLD and so on)  Acute abdominal pain, likely acute cholangitis -Given the image finding, suspect CBD obstruction. -Continue Zosyn, GI consulted, plans for ERCP. -PPI for symptoms and suspected underlying GERD. -Repeat LFTs in AM.  Acute transaminitis and acute bilirubinemia -As above.  Alcohol abuse -No symptoms or signs of active withdrawal, CIWA protocol with as needed  benzos.  Obesity -Outpatient PCP follow-up.  DVT prophylaxis: SCD Code Status: Full code Family Communication: None at bedside Disposition Plan: Expect more than 2 midnight hospital stay, for GI intervention/ERCP. Consults called: [facility] Admission status: Medusrg admit   Lequita Halt MD [facility] Pager [phone]  09/13/2021, 4:50 PM

## 2021-09-13 NOTE — ED Provider Notes (Signed)
°  Physical Exam  BP 125/75    Pulse 91    Temp 98.3 F (36.8 C) (Oral)    Resp (!) 21    Ht 5\' 1"  (1.549 m)    Wt 76.2 kg    SpO2 95%    BMI 31.74 kg/m   Physical Exam  ED Course/Procedures     Procedures  MDM  Care assumed at 3 PM.  Patient is here with abdominal pain and elevated LFTs.  CT showed dilated CBD.  Concern for possible choledocholithiasis.  GI saw patient and recommended ERCP on Friday.  Recommend clear liquids.  Hospitalist to admit      Sunday, MD 09/13/21 [phone]

## 2021-09-14 ENCOUNTER — Encounter (HOSPITAL_COMMUNITY): Payer: Self-pay | Admitting: Internal Medicine

## 2021-09-14 LAB — COMPREHENSIVE METABOLIC PANEL
ALT: 290 U/L — ABNORMAL HIGH (ref 0–44)
AST: 111 U/L — ABNORMAL HIGH (ref 15–41)
Albumin: 3.1 g/dL — ABNORMAL LOW (ref 3.5–5.0)
Alkaline Phosphatase: 324 U/L — ABNORMAL HIGH (ref 38–126)
Anion gap: 8 (ref 5–15)
BUN: 9 mg/dL (ref 6–20)
CO2: 27 mmol/L (ref 22–32)
Calcium: 9.2 mg/dL (ref 8.9–10.3)
Chloride: 104 mmol/L (ref 98–111)
Creatinine, Ser: 1.19 mg/dL — ABNORMAL HIGH (ref 0.44–1.00)
GFR, Estimated: 53 mL/min — ABNORMAL LOW (ref >60)
Glucose, Bld: 103 mg/dL — ABNORMAL HIGH (ref 70–99)
Potassium: 3.8 mmol/L (ref 3.5–5.1)
Sodium: 139 mmol/L (ref 135–145)
Total Bilirubin: 2.3 mg/dL — ABNORMAL HIGH (ref 0.3–1.2)
Total Protein: 7 g/dL (ref 6.5–8.1)

## 2021-09-14 LAB — HIV ANTIBODY (ROUTINE TESTING W REFLEX): HIV Screen 4th Generation wRfx: NONREACTIVE

## 2021-09-14 MED ORDER — SODIUM CHLORIDE 0.9 % IV SOLN: INTRAVENOUS | Status: DC

## 2021-09-14 NOTE — Progress Notes (Addendum)
Daily Rounding Note  09/14/2021, 9:35 AM  LOS: 1 day   SUBJECTIVE:   Chief complaint:   Normal LFTs, abdominal pain, dilated bile ducts.     No further abdominal pain, no nausea or vomiting since arrival in the ED yesterday.  Feels well.  Tolerating full liquids.  OBJECTIVE:         Vital signs in last 24 hours:    Temp:  [97.7 F (36.5 C)-98.3 F (36.8 C)] 97.7 F (36.5 C) (12/15 0835) Pulse Rate:  [80-100] 85 (12/15 0835) Resp:  [12-23] 19 (12/15 0835) BP: (97-133)/(61-77) 97/64 (12/15 0835) SpO2:  [94 %-98 %] 94 % (12/15 0835) Last BM Date: 09/12/21 Filed Weights   09/13/21 0042  Weight: 76.2 kg   General: Looks well.  Comfortable. Heart: RRR. Chest: No labored breathing or cough Abdomen: Soft without tenderness.  No HSM, masses, bruits, hernias Extremities: No CCE. Neuro/Psych: Pleasant, calm, cooperative.  Fluid speech.  No gross deficits.  No tremors  Intake/Output from previous day: 12/14 0701 - 12/15 0700 In: 50 [IV Piggyback:50] Out: -   Intake/Output this shift: No intake/output data recorded.  Lab Results: Recent Labs    09/13/21 0012  WBC 11.8*  HGB 13.9  HCT 41.4  PLT 273   BMET Recent Labs    09/13/21 0012 09/14/21 0333  NA 135 139  K 3.7 3.8  CL 100 104  CO2 27 27  GLUCOSE 212* 103*  BUN 10 9  CREATININE 1.04* 1.19*  CALCIUM 9.0 9.2   LFT Recent Labs    09/13/21 0012 09/14/21 0333  PROT 7.4 7.0  ALBUMIN 3.6 3.1*  AST 190* 111*  ALT 291* 290*  ALKPHOS 344* 324*  BILITOT 1.9* 2.3*   PT/INR No results for input(s): LABPROT, INR in the last 72 hours. Hepatitis Panel No results for input(s): HEPBSAG, HCVAB, HEPAIGM, HEPBIGM in the last 72 hours.  Studies/Results: DG Chest 2 View  Result Date: 09/13/2021 CLINICAL DATA:  Chest pain EXAM: CHEST - 2 VIEW COMPARISON:  12/01/2019 FINDINGS: Lungs are clear.  No pleural effusion or pneumothorax. The heart is normal in  size. Visualized osseous structures are within normal limits. Cholecystectomy clips. IMPRESSION: Normal chest radiographs. Electronically Signed   By: Charline Bills M.D.   On: 09/13/2021 01:05   CT Angio Chest/Abd/Pel for Dissection W and/or Wo Contrast  Result Date: 09/13/2021 CLINICAL DATA:  Chest and back pain.  Concern for aortic dissection. EXAM: CT ANGIOGRAPHY CHEST, ABDOMEN AND PELVIS TECHNIQUE: Non-contrast CT of the chest was initially obtained. Multidetector CT imaging through the chest, abdomen and pelvis was performed using the standard protocol during bolus administration of intravenous contrast. Multiplanar reconstructed images and MIPs were obtained and reviewed to evaluate the vascular anatomy. CONTRAST:  OMNIPAQUE IOHEXOL 350 MG/ML SOLN COMPARISON:  CT of the abdomen pelvis dated 08/29/2013. Chest radiograph dated 09/13/2021. FINDINGS: CTA CHEST FINDINGS Cardiovascular: There is no cardiomegaly or pericardial effusion the thoracic aorta is unremarkable. The origins of the great vessels of the aortic arch appear patent. Evaluation of the pulmonary arteries is limited due to respiratory motion artifact. No pulmonary artery embolus item Mediastinum/Nodes: No hilar or mediastinal adenopathy. The esophagus is grossly unremarkable. No mediastinal fluid collection. Lungs/Pleura: Minimal bibasilar atelectasis. No focal consolidation, pleural effusion, or pneumothorax. The central airways are patent. Musculoskeletal: Bilateral breast implants. No acute osseous pathology. Review of the MIP images confirms the above findings. CTA ABDOMEN AND PELVIS FINDINGS VASCULAR Aorta:  Normal caliber aorta without aneurysm, dissection, vasculitis or significant stenosis. Celiac: Patent without evidence of aneurysm, dissection, vasculitis or significant stenosis. SMA: Patent without evidence of aneurysm, dissection, vasculitis or significant stenosis. Renals: Both renal arteries are patent without evidence of  aneurysm, dissection, vasculitis, fibromuscular dysplasia or significant stenosis. IMA: Patent without evidence of aneurysm, dissection, vasculitis or significant stenosis. Inflow: Patent without evidence of aneurysm, dissection, vasculitis or significant stenosis. Veins: No obvious venous abnormality within the limitations of this arterial phase study. Review of the MIP images confirms the above findings. NON-VASCULAR No intra-abdominal free air or free fluid. Hepatobiliary: Slight heterogeneous enhancement of the liver. There is a 2 cm left hepatic cyst as well as smaller additional cysts, present on the prior CT but slightly increased in size since 2014. There is dilatation of the common bile duct and intrahepatic biliary trees, likely post cholecystectomy. The common bile duct measures approximately 17 mm in diameter. No retained calcified stone noted in the central CBD. There is however faint soft tissue density in the region of the MP of (167/6 and coronal 70/9) which may represent ampullary tissue or debris. This can be better evaluated with MRI/MRCP, possibly on a nonemergent/outpatient basis. Pancreas: Unremarkable. No pancreatic ductal dilatation or surrounding inflammatory changes. Spleen: Normal in size without focal abnormality. Adrenals/Urinary Tract: The adrenal glands unremarkable. The kidneys, visualized ureters, and urinary bladder appear unremarkable. Stomach/Bowel: Small scattered sigmoid diverticula without active inflammatory changes. There is no bowel obstruction or active inflammation. The appendix is normal. Lymphatic: No adenopathy. Reproductive: The uterus and ovaries are grossly unremarkable. No adnexal masses. Other: None Musculoskeletal: No acute or significant osseous findings. Review of the MIP images confirms the above findings. IMPRESSION: 1. No acute intrathoracic, abdominal, or pelvic pathology. No aortic aneurysm or dissection. 2. Cholecystectomy with dilatation of the common  bile duct and intrahepatic biliary trees. MRI/MRCP may provide better evaluation, possibly on a nonemergent/outpatient basis. 3. Small scattered sigmoid diverticula without active inflammatory changes. No bowel obstruction. Normal appendix. Electronically Signed   By: Anner Crete M.D.   On: 09/13/2021 01:52   US Abdomen Limited RUQ (LIVER/GB)  Result Date: 09/13/2021 CLINICAL DATA:  Common bile duct dilatation on CT. EXAM: ULTRASOUND ABDOMEN LIMITED RIGHT UPPER QUADRANT COMPARISON:  CT 09/13/2021 FINDINGS: Gallbladder: Postcholecystectomy. Common bile duct: Diameter: Dilated 9 mm. Liver: Mild intrahepatic biliary duct dilatation similar to comparison CT. Several cystic lesions in liver. Mild increased liver echogenicity portal vein is patent on color Doppler imaging with normal direction of blood flow towards the liver. Other: None. IMPRESSION: 1. Dilatation of the common bile duct as seen on comparison CT. 2. Mild intrahepatic biliary duct dilatation seen on comparison CT. 3. Mild increased liver echogenicity. Electronically Signed   By: Suzy Bouchard M.D.   On: 09/13/2021 13:03    ASSESMENT:   Abdominal pain, elevated LFTs.  Imaging including CT scan, ultrasound confirming dilated intrahepatic ducts and CBD, possible CBD sludge versus prominent ampullary tissue, liver cysts (known entity).  T bili has risen, alkaline phosphatase minimally improved, AST improved, ALT stable.  Previous Chole and ERCP ~ age 9.    Hyperglycemia without previous diagnosis of diabetes.   PLAN   ERCP is set for 1 PM Friday.  Ordered hemoglobin A1c for the morning.  Continue Zosyn.  Add advanced diet to carb modified.  Can have clear liquid tray in the morning but n.p.o. after 8 AM.      Azucena Freed  09/14/2021, 9:35 AM Phone [phone]

## 2021-09-14 NOTE — Progress Notes (Signed)
PROGRESS NOTE    Jean GoutyMary Bennett  VWU:[id number] DOB: 03/23/1961 DOA: 09/12/2021 PCP: Endocrinology, [facility]   Brief Narrative: 60 year old with past medical history significant for obesity, cholecystectomy who presents complaining of epigastric pain for 1 week.  He usually drinks 1 beer per day.  Patient was found to have borderline tachycardia, transaminases: AST 190 ALT 291, alkaline phosphatase 344, bilirubin 1.9. CTA chest,   abdomen pelvis; no acute intrathoracic abdominal or pelvic pathology.  No aortic aneurysm or dissection.  Cholecystectomy with dilation of the common bile duct and intrahepatic biliary tree.    Assessment & Plan:   Principal Problem:   Cholangitis Active Problems:   Bacterial cholangitis  1-Abdominal Pain/ CBD Dilation, Transaminases;  -CTA abdomen, pelvis: No acute intrathoracic, abdominal or pelvic pathology.  Cholecystectomy with dilation of the common bile duct and intrahepatic biliary trees. -Right upper quadrant ultrasound: Dilation of the common bile duct.  Mild intrahepatic biliary duct dilation.  Mild increased liver echogenicity. -Continue with Zosyn to cover for infection.  -Mild elevation of lipase.  -Patient has remain afebrile.  -Plan for ERCP 12/16.  Alcohol Use:  Continue with CIWA and thiamine, folic acid.   Obesity: Needs life style modification.   Hyperglycemia; plan to check A1c.    Estimated body mass index is 31.74 kg/m as calculated from the following:   Height as of this encounter: 5\' 1"  (1.549 m).   Weight as of this encounter: 76.2 kg.   DVT prophylaxis: SCD Code Status: Full code Family Communication: care discussed with patient  Disposition Plan:  Status is: Inpatient  Remains inpatient appropriate because: CBD obstruction, abdominal pain.         Consultants:  GI  Procedures:    Antimicrobials:  Zosyn   Subjective: She is alert, denies worsening abdominal pain. She has had several episodes  abdominal pain. This time pain persist, and presented for evaluation. She think hamburger precipitated her pain.   Objective: Vitals:   09/13/21 2039 09/13/21 2055 09/14/21 0050 09/14/21 0540  BP: 133/67 118/69 107/68 116/68  Pulse: 94 90 80 84  Resp: 20 20 20 20   Temp: 98.2 F (36.8 C) 98 F (36.7 C) 98.2 F (36.8 C) 97.7 F (36.5 C)  TempSrc: Oral Oral Oral Oral  SpO2: 94% 96% 95% 94%  Weight:      Height:        Intake/Output Summary (Last 24 hours) at 09/14/2021 0742 Last data filed at 09/13/2021 1855 Gross per 24 hour  Intake 50 ml  Output --  Net 50 ml   Filed Weights   09/13/21 0042  Weight: 76.2 kg    Examination:  General exam: Appears calm and comfortable  Respiratory system: Clear to auscultation. Respiratory effort normal. Cardiovascular system: S1 & S2 heard, RRR. No JVD, murmurs, rubs, gallops or clicks. No pedal edema. Gastrointestinal system: Abdomen is nondistended, soft and nontender. No organomegaly or masses felt. Normal bowel sounds heard. Central nervous system: Alert and oriented. No focal neurological deficits. Extremities: Symmetric 5 x 5 power.   Data Reviewed: I have personally reviewed following labs and imaging studies  CBC: Recent Labs  Lab 09/13/21 0012  WBC 11.8*  NEUTROABS 10.4*  HGB 13.9  HCT 41.4  MCV 96.1  PLT 273   Basic Metabolic Panel: Recent Labs  Lab 09/13/21 0012 09/14/21 0333  NA 135 139  K 3.7 3.8  CL 100 104  CO2 27 27  GLUCOSE 212* 103*  BUN 10 9  CREATININE 1.04* 1.19*  CALCIUM  9.0 9.2   GFR: Estimated Creatinine Clearance: 48.2 mL/min (A) (by C-G formula based on SCr of 1.19 mg/dL (H)). Liver Function Tests: Recent Labs  Lab 09/13/21 0012 09/14/21 0333  AST 190* 111*  ALT 291* 290*  ALKPHOS 344* 324*  BILITOT 1.9* 2.3*  PROT 7.4 7.0  ALBUMIN 3.6 3.1*   Recent Labs  Lab 09/13/21 0012  LIPASE 55*   No results for input(s): AMMONIA in the last 168 hours. Coagulation Profile: No  results for input(s): INR, PROTIME in the last 168 hours. Cardiac Enzymes: No results for input(s): CKTOTAL, CKMB, CKMBINDEX, TROPONINI in the last 168 hours. BNP (last 3 results) No results for input(s): PROBNP in the last 8760 hours. HbA1C: No results for input(s): HGBA1C in the last 72 hours. CBG: No results for input(s): GLUCAP in the last 168 hours. Lipid Profile: No results for input(s): CHOL, HDL, LDLCALC, TRIG, CHOLHDL, LDLDIRECT in the last 72 hours. Thyroid Function Tests: No results for input(s): TSH, T4TOTAL, FREET4, T3FREE, THYROIDAB in the last 72 hours. Anemia Panel: No results for input(s): VITAMINB12, FOLATE, FERRITIN, TIBC, IRON, RETICCTPCT in the last 72 hours. Sepsis Labs: No results for input(s): PROCALCITON, LATICACIDVEN in the last 168 hours.  No results found for this or any previous visit (from the past 240 hour(s)).       Radiology Studies: DG Chest 2 View  Result Date: 09/13/2021 CLINICAL DATA:  Chest pain EXAM: CHEST - 2 VIEW COMPARISON:  12/01/2019 FINDINGS: Lungs are clear.  No pleural effusion or pneumothorax. The heart is normal in size. Visualized osseous structures are within normal limits. Cholecystectomy clips. IMPRESSION: Normal chest radiographs. Electronically Signed   By: Julian Hy M.D.   On: 09/13/2021 01:05   CT Angio Chest/Abd/Pel for Dissection W and/or Wo Contrast  Result Date: 09/13/2021 CLINICAL DATA:  Chest and back pain.  Concern for aortic dissection. EXAM: CT ANGIOGRAPHY CHEST, ABDOMEN AND PELVIS TECHNIQUE: Non-contrast CT of the chest was initially obtained. Multidetector CT imaging through the chest, abdomen and pelvis was performed using the standard protocol during bolus administration of intravenous contrast. Multiplanar reconstructed images and MIPs were obtained and reviewed to evaluate the vascular anatomy. CONTRAST:  158mL OMNIPAQUE IOHEXOL 350 MG/ML SOLN COMPARISON:  CT of the abdomen pelvis dated 08/29/2013. Chest  radiograph dated 09/13/2021. FINDINGS: CTA CHEST FINDINGS Cardiovascular: There is no cardiomegaly or pericardial effusion the thoracic aorta is unremarkable. The origins of the great vessels of the aortic arch appear patent. Evaluation of the pulmonary arteries is limited due to respiratory motion artifact. No pulmonary artery embolus item Mediastinum/Nodes: No hilar or mediastinal adenopathy. The esophagus is grossly unremarkable. No mediastinal fluid collection. Lungs/Pleura: Minimal bibasilar atelectasis. No focal consolidation, pleural effusion, or pneumothorax. The central airways are patent. Musculoskeletal: Bilateral breast implants. No acute osseous pathology. Review of the MIP images confirms the above findings. CTA ABDOMEN AND PELVIS FINDINGS VASCULAR Aorta: Normal caliber aorta without aneurysm, dissection, vasculitis or significant stenosis. Celiac: Patent without evidence of aneurysm, dissection, vasculitis or significant stenosis. SMA: Patent without evidence of aneurysm, dissection, vasculitis or significant stenosis. Renals: Both renal arteries are patent without evidence of aneurysm, dissection, vasculitis, fibromuscular dysplasia or significant stenosis. IMA: Patent without evidence of aneurysm, dissection, vasculitis or significant stenosis. Inflow: Patent without evidence of aneurysm, dissection, vasculitis or significant stenosis. Veins: No obvious venous abnormality within the limitations of this arterial phase study. Review of the MIP images confirms the above findings. NON-VASCULAR No intra-abdominal free air or free fluid. Hepatobiliary: Slight heterogeneous  enhancement of the liver. There is a 2 cm left hepatic cyst as well as smaller additional cysts, present on the prior CT but slightly increased in size since 2014. There is dilatation of the common bile duct and intrahepatic biliary trees, likely post cholecystectomy. The common bile duct measures approximately 17 mm in diameter. No  retained calcified stone noted in the central CBD. There is however faint soft tissue density in the region of the MP of (167/6 and coronal 70/9) which may represent ampullary tissue or debris. This can be better evaluated with MRI/MRCP, possibly on a nonemergent/outpatient basis. Pancreas: Unremarkable. No pancreatic ductal dilatation or surrounding inflammatory changes. Spleen: Normal in size without focal abnormality. Adrenals/Urinary Tract: The adrenal glands unremarkable. The kidneys, visualized ureters, and urinary bladder appear unremarkable. Stomach/Bowel: Small scattered sigmoid diverticula without active inflammatory changes. There is no bowel obstruction or active inflammation. The appendix is normal. Lymphatic: No adenopathy. Reproductive: The uterus and ovaries are grossly unremarkable. No adnexal masses. Other: None Musculoskeletal: No acute or significant osseous findings. Review of the MIP images confirms the above findings. IMPRESSION: 1. No acute intrathoracic, abdominal, or pelvic pathology. No aortic aneurysm or dissection. 2. Cholecystectomy with dilatation of the common bile duct and intrahepatic biliary trees. MRI/MRCP may provide better evaluation, possibly on a nonemergent/outpatient basis. 3. Small scattered sigmoid diverticula without active inflammatory changes. No bowel obstruction. Normal appendix. Electronically Signed   By: Elgie Collard M.D.   On: 09/13/2021 01:52   US Abdomen Limited RUQ (LIVER/GB)  Result Date: 09/13/2021 CLINICAL DATA:  Common bile duct dilatation on CT. EXAM: ULTRASOUND ABDOMEN LIMITED RIGHT UPPER QUADRANT COMPARISON:  CT 09/13/2021 FINDINGS: Gallbladder: Postcholecystectomy. Common bile duct: Diameter: Dilated 9 mm. Liver: Mild intrahepatic biliary duct dilatation similar to comparison CT. Several cystic lesions in liver. Mild increased liver echogenicity portal vein is patent on color Doppler imaging with normal direction of blood flow towards the  liver. Other: None. IMPRESSION: 1. Dilatation of the common bile duct as seen on comparison CT. 2. Mild intrahepatic biliary duct dilatation seen on comparison CT. 3. Mild increased liver echogenicity. Electronically Signed   By: Genevive Bi M.D.   On: 09/13/2021 13:03        Scheduled Meds:  folic acid  1 mg Oral Daily   indomethacin  100 mg Rectal Once   multivitamin with minerals  1 tablet Oral Daily   pantoprazole  40 mg Oral Daily   thiamine  100 mg Oral Daily   Or   thiamine  100 mg Intravenous Daily   Continuous Infusions:  piperacillin-tazobactam (ZOSYN)  IV 3.375 g (09/14/21 0524)     LOS: 1 day    Time spent: 35 minutes.     Alba Cory, MD [facility]   If 7PM-7AM, please contact night-coverage [email or web address]  09/14/2021, 7:42 AM

## 2021-09-14 NOTE — Social Work (Signed)
CSW spoke to pt about alcohol use. Pt states she did not need resources and did not want to answer any other questions about use.   Jimmy Picket, LCSW Clinical Social Worker

## 2021-09-14 NOTE — Plan of Care (Signed)
Explained plan of care and dietary orders for today and at midnight.

## 2021-09-14 NOTE — H&P (View-Only) (Signed)
Daily Rounding Note  09/14/2021, 9:35 AM  LOS: 1 day   SUBJECTIVE:   Chief complaint:   Normal LFTs, abdominal pain, dilated bile ducts.     No further abdominal pain, no nausea or vomiting since arrival in the ED yesterday.  Feels well.  Tolerating full liquids.  OBJECTIVE:         Vital signs in last 24 hours:    Temp:  [97.7 F (36.5 C)-98.3 F (36.8 C)] 97.7 F (36.5 C) (12/15 0835) Pulse Rate:  [80-100] 85 (12/15 0835) Resp:  [12-23] 19 (12/15 0835) BP: (97-133)/(61-77) 97/64 (12/15 0835) SpO2:  [94 %-98 %] 94 % (12/15 0835) Last BM Date: 09/12/21 Filed Weights   09/13/21 0042  Weight: 76.2 kg   General: Looks well.  Comfortable. Heart: RRR. Chest: No labored breathing or cough Abdomen: Soft without tenderness.  No HSM, masses, bruits, hernias Extremities: No CCE. Neuro/Psych: Pleasant, calm, cooperative.  Fluid speech.  No gross deficits.  No tremors  Intake/Output from previous day: 12/14 0701 - 12/15 0700 In: 50 [IV Piggyback:50] Out: -   Intake/Output this shift: No intake/output data recorded.  Lab Results: Recent Labs    09/13/21 0012  WBC 11.8*  HGB 13.9  HCT 41.4  PLT 273   BMET Recent Labs    09/13/21 0012 09/14/21 0333  NA 135 139  K 3.7 3.8  CL 100 104  CO2 27 27  GLUCOSE 212* 103*  BUN 10 9  CREATININE 1.04* 1.19*  CALCIUM 9.0 9.2   LFT Recent Labs    09/13/21 0012 09/14/21 0333  PROT 7.4 7.0  ALBUMIN 3.6 3.1*  AST 190* 111*  ALT 291* 290*  ALKPHOS 344* 324*  BILITOT 1.9* 2.3*   PT/INR No results for input(s): LABPROT, INR in the last 72 hours. Hepatitis Panel No results for input(s): HEPBSAG, HCVAB, HEPAIGM, HEPBIGM in the last 72 hours.  Studies/Results: DG Chest 2 View  Result Date: 09/13/2021 CLINICAL DATA:  Chest pain EXAM: CHEST - 2 VIEW COMPARISON:  12/01/2019 FINDINGS: Lungs are clear.  No pleural effusion or pneumothorax. The heart is normal in  size. Visualized osseous structures are within normal limits. Cholecystectomy clips. IMPRESSION: Normal chest radiographs. Electronically Signed   By: Charline Bills M.D.   On: 09/13/2021 01:05   CT Angio Chest/Abd/Pel for Dissection W and/or Wo Contrast  Result Date: 09/13/2021 CLINICAL DATA:  Chest and back pain.  Concern for aortic dissection. EXAM: CT ANGIOGRAPHY CHEST, ABDOMEN AND PELVIS TECHNIQUE: Non-contrast CT of the chest was initially obtained. Multidetector CT imaging through the chest, abdomen and pelvis was performed using the standard protocol during bolus administration of intravenous contrast. Multiplanar reconstructed images and MIPs were obtained and reviewed to evaluate the vascular anatomy. CONTRAST:  OMNIPAQUE IOHEXOL 350 MG/ML SOLN COMPARISON:  CT of the abdomen pelvis dated 08/29/2013. Chest radiograph dated 09/13/2021. FINDINGS: CTA CHEST FINDINGS Cardiovascular: There is no cardiomegaly or pericardial effusion the thoracic aorta is unremarkable. The origins of the great vessels of the aortic arch appear patent. Evaluation of the pulmonary arteries is limited due to respiratory motion artifact. No pulmonary artery embolus item Mediastinum/Nodes: No hilar or mediastinal adenopathy. The esophagus is grossly unremarkable. No mediastinal fluid collection. Lungs/Pleura: Minimal bibasilar atelectasis. No focal consolidation, pleural effusion, or pneumothorax. The central airways are patent. Musculoskeletal: Bilateral breast implants. No acute osseous pathology. Review of the MIP images confirms the above findings. CTA ABDOMEN AND PELVIS FINDINGS VASCULAR Aorta:  Normal caliber aorta without aneurysm, dissection, vasculitis or significant stenosis. Celiac: Patent without evidence of aneurysm, dissection, vasculitis or significant stenosis. SMA: Patent without evidence of aneurysm, dissection, vasculitis or significant stenosis. Renals: Both renal arteries are patent without evidence of  aneurysm, dissection, vasculitis, fibromuscular dysplasia or significant stenosis. IMA: Patent without evidence of aneurysm, dissection, vasculitis or significant stenosis. Inflow: Patent without evidence of aneurysm, dissection, vasculitis or significant stenosis. Veins: No obvious venous abnormality within the limitations of this arterial phase study. Review of the MIP images confirms the above findings. NON-VASCULAR No intra-abdominal free air or free fluid. Hepatobiliary: Slight heterogeneous enhancement of the liver. There is a 2 cm left hepatic cyst as well as smaller additional cysts, present on the prior CT but slightly increased in size since 2014. There is dilatation of the common bile duct and intrahepatic biliary trees, likely post cholecystectomy. The common bile duct measures approximately 17 mm in diameter. No retained calcified stone noted in the central CBD. There is however faint soft tissue density in the region of the MP of (167/6 and coronal 70/9) which may represent ampullary tissue or debris. This can be better evaluated with MRI/MRCP, possibly on a nonemergent/outpatient basis. Pancreas: Unremarkable. No pancreatic ductal dilatation or surrounding inflammatory changes. Spleen: Normal in size without focal abnormality. Adrenals/Urinary Tract: The adrenal glands unremarkable. The kidneys, visualized ureters, and urinary bladder appear unremarkable. Stomach/Bowel: Small scattered sigmoid diverticula without active inflammatory changes. There is no bowel obstruction or active inflammation. The appendix is normal. Lymphatic: No adenopathy. Reproductive: The uterus and ovaries are grossly unremarkable. No adnexal masses. Other: None Musculoskeletal: No acute or significant osseous findings. Review of the MIP images confirms the above findings. IMPRESSION: 1. No acute intrathoracic, abdominal, or pelvic pathology. No aortic aneurysm or dissection. 2. Cholecystectomy with dilatation of the common  bile duct and intrahepatic biliary trees. MRI/MRCP may provide better evaluation, possibly on a nonemergent/outpatient basis. 3. Small scattered sigmoid diverticula without active inflammatory changes. No bowel obstruction. Normal appendix. Electronically Signed   By: Arash  Radparvar M.D.   On: 09/13/2021 01:52  ° °US Abdomen Limited RUQ (LIVER/GB) ° °Result Date: 09/13/2021 °CLINICAL DATA:  Common bile duct dilatation on CT. EXAM: ULTRASOUND ABDOMEN LIMITED RIGHT UPPER QUADRANT COMPARISON:  CT 09/13/2021 FINDINGS: Gallbladder: Postcholecystectomy. Common bile duct: Diameter: Dilated 9 mm. Liver: Mild intrahepatic biliary duct dilatation similar to comparison CT. Several cystic lesions in liver. Mild increased liver echogenicity portal vein is patent on color Doppler imaging with normal direction of blood flow towards the liver. Other: None. IMPRESSION: 1. Dilatation of the common bile duct as seen on comparison CT. 2. Mild intrahepatic biliary duct dilatation seen on comparison CT. 3. Mild increased liver echogenicity. Electronically Signed   By: Stewart  Edmunds M.D.   On: 09/13/2021 13:03   ° °ASSESMENT:  ° °Abdominal pain, elevated LFTs.  Imaging including CT scan, ultrasound confirming dilated intrahepatic ducts and CBD, possible CBD sludge versus prominent ampullary tissue, liver cysts (known entity).  T bili has risen, alkaline phosphatase minimally improved, AST improved, ALT stable.  Previous Chole and ERCP ~ age 30.   ° °Hyperglycemia without previous diagnosis of diabetes. ° ° °PLAN  ° °ERCP is set for 1 PM Friday.  Ordered hemoglobin A1c for the morning.  Continue Zosyn.  Add advanced diet to carb modified.  Can have clear liquid tray in the morning but n.p.o. after 8 AM.   ° ° ° °Therasa Lorenzi  09/14/2021, 9:35 AM °Phone [phone]  °

## 2021-09-15 ENCOUNTER — Inpatient Hospital Stay (HOSPITAL_COMMUNITY): Payer: 59 | Admitting: Anesthesiology

## 2021-09-15 ENCOUNTER — Encounter (HOSPITAL_COMMUNITY)
Admission: EM | Disposition: A | Discharge status: Home / Self Care | Payer: Self-pay | Source: Home / Self Care | Attending: Internal Medicine

## 2021-09-15 ENCOUNTER — Inpatient Hospital Stay (HOSPITAL_COMMUNITY): Payer: 59

## 2021-09-15 ENCOUNTER — Encounter (HOSPITAL_COMMUNITY): Payer: Self-pay | Admitting: Internal Medicine

## 2021-09-15 DIAGNOSIS — K8051 Calculus of bile duct without cholangitis or cholecystitis with obstruction: Secondary | ICD-10-CM

## 2021-09-15 HISTORY — PX: ENDOSCOPIC RETROGRADE CHOLANGIOPANCREATOGRAPHY (ERCP) WITH PROPOFOL: SHX5810

## 2021-09-15 HISTORY — PX: REMOVAL OF STONES: SHX5545

## 2021-09-15 LAB — COMPREHENSIVE METABOLIC PANEL
ALT: 168 U/L — ABNORMAL HIGH (ref 0–44)
AST: 35 U/L (ref 15–41)
Albumin: 2.9 g/dL — ABNORMAL LOW (ref 3.5–5.0)
Alkaline Phosphatase: 230 U/L — ABNORMAL HIGH (ref 38–126)
Anion gap: 9 (ref 5–15)
BUN: 9 mg/dL (ref 6–20)
CO2: 21 mmol/L — ABNORMAL LOW (ref 22–32)
Calcium: 8.5 mg/dL — ABNORMAL LOW (ref 8.9–10.3)
Chloride: 108 mmol/L (ref 98–111)
Creatinine, Ser: 1.02 mg/dL — ABNORMAL HIGH (ref 0.44–1.00)
GFR, Estimated: >60 mL/min (ref >60)
Glucose, Bld: 100 mg/dL — ABNORMAL HIGH (ref 70–99)
Potassium: 3.6 mmol/L (ref 3.5–5.1)
Sodium: 138 mmol/L (ref 135–145)
Total Bilirubin: 1 mg/dL (ref 0.3–1.2)
Total Protein: 6.1 g/dL — ABNORMAL LOW (ref 6.5–8.1)

## 2021-09-15 LAB — CBC
HCT: 39.5 % (ref 36.0–46.0)
Hemoglobin: 13.3 g/dL (ref 12.0–15.0)
MCH: 32.2 pg (ref 26.0–34.0)
MCHC: 33.7 g/dL (ref 30.0–36.0)
MCV: 95.6 fL (ref 80.0–100.0)
Platelets: 266 10*3/uL (ref 150–400)
RBC: 4.13 MIL/uL (ref 3.87–5.11)
RDW: 12.2 % (ref 11.5–15.5)
WBC: 5.9 10*3/uL (ref 4.0–10.5)
nRBC: 0 % (ref 0.0–0.2)

## 2021-09-15 LAB — HEMOGLOBIN A1C
Hgb A1c MFr Bld: 5.4 % (ref 4.8–5.6)
Mean Plasma Glucose: 108.28 mg/dL

## 2021-09-15 SURGERY — ENDOSCOPIC RETROGRADE CHOLANGIOPANCREATOGRAPHY (ERCP) WITH PROPOFOL
Anesthesia: General

## 2021-09-15 MED ORDER — GLUCAGON HCL RDNA (DIAGNOSTIC) 1 MG IJ SOLR
INTRAMUSCULAR | Status: AC
  Filled 2021-09-15: qty 1

## 2021-09-15 MED ORDER — DEXAMETHASONE SODIUM PHOSPHATE 10 MG/ML IJ SOLN
INTRAMUSCULAR | Status: DC | PRN
  Administered 2021-09-15: 10 mg via INTRAVENOUS

## 2021-09-15 MED ORDER — FENTANYL CITRATE (PF) 250 MCG/5ML IJ SOLN
INTRAMUSCULAR | Status: DC | PRN
  Administered 2021-09-15: 100 ug via INTRAVENOUS

## 2021-09-15 MED ORDER — PHENYLEPHRINE HCL-NACL 20-0.9 MG/250ML-% IV SOLN
INTRAVENOUS | Status: DC | PRN
  Administered 2021-09-15: 20 ug/min via INTRAVENOUS

## 2021-09-15 MED ORDER — SUGAMMADEX SODIUM 200 MG/2ML IV SOLN
INTRAVENOUS | Status: DC | PRN
  Administered 2021-09-15: 200 mg via INTRAVENOUS

## 2021-09-15 MED ORDER — INDOMETHACIN 50 MG RE SUPP
RECTAL | Status: AC
  Filled 2021-09-15: qty 2

## 2021-09-15 MED ORDER — MIDAZOLAM HCL 2 MG/2ML IJ SOLN
INTRAMUSCULAR | Status: DC | PRN
  Administered 2021-09-15: 2 mg via INTRAVENOUS

## 2021-09-15 MED ORDER — SODIUM CHLORIDE 0.9 % IV SOLN
INTRAVENOUS | Status: DC | PRN
  Administered 2021-09-15: 40 mL

## 2021-09-15 MED ORDER — INDOMETHACIN 50 MG RE SUPP
RECTAL | Status: DC | PRN
  Administered 2021-09-15: 100 mg via RECTAL

## 2021-09-15 MED ORDER — LIDOCAINE 2% (20 MG/ML) 5 ML SYRINGE
INTRAMUSCULAR | Status: DC | PRN
  Administered 2021-09-15: 100 mg via INTRAVENOUS

## 2021-09-15 MED ORDER — PROPOFOL 10 MG/ML IV BOLUS
INTRAVENOUS | Status: DC | PRN
  Administered 2021-09-15: 150 mg via INTRAVENOUS

## 2021-09-15 MED ORDER — LACTATED RINGERS IV SOLN: INTRAVENOUS | Status: DC

## 2021-09-15 MED ORDER — PHENYLEPHRINE 40 MCG/ML (10ML) SYRINGE FOR IV PUSH (FOR BLOOD PRESSURE SUPPORT)
PREFILLED_SYRINGE | INTRAVENOUS | Status: DC | PRN
  Administered 2021-09-15: 80 ug via INTRAVENOUS
  Administered 2021-09-15: 160 ug via INTRAVENOUS
  Administered 2021-09-15: 120 ug via INTRAVENOUS

## 2021-09-15 MED ORDER — ROCURONIUM BROMIDE 10 MG/ML (PF) SYRINGE
PREFILLED_SYRINGE | INTRAVENOUS | Status: DC | PRN
  Administered 2021-09-15: 60 mg via INTRAVENOUS

## 2021-09-15 MED ORDER — ONDANSETRON HCL 4 MG/2ML IJ SOLN
INTRAMUSCULAR | Status: DC | PRN
  Administered 2021-09-15: 4 mg via INTRAVENOUS

## 2021-09-15 MED ORDER — CIPROFLOXACIN IN D5W 400 MG/200ML IV SOLN
INTRAVENOUS | Status: AC
  Filled 2021-09-15: qty 200

## 2021-09-15 NOTE — Op Note (Signed)
[facility] Patient Name: Jean Bennett Procedure Date : 09/15/2021 MRN: [id number] Attending MD: Rachael Fee , MD Date of Birth: 1960-10-15 CSN: [id number] Age: 60 Admit Type: Inpatient Procedure:                ERCP Indications:              remote GB resection with ERCP afterwards for                            retained stone (per patient, 30 years ago); now                            with abd pain, elevated LFTs, abnormal bile duct on                            CT and [place] Providers:                Rachael Fee, MD, Adolph Pollack, RN, Rozetta Nunnery, Technician Referring MD:              Medicines:                General Anesthesia, Indomethacin 100 mg PR, IV                            zosyn in hosp Complications:            No immediate complications. Estimated blood loss:                            None Estimated Blood Loss:     Estimated blood loss: none. Procedure:                Pre-Anesthesia Assessment:                           - Prior to the procedure, a History and Physical                            was performed, and patient medications and                            allergies were reviewed. The patient's tolerance of                            previous anesthesia was also reviewed. The risks                            and benefits of the procedure and the sedation                            options and risks were discussed with the patient.  All questions were answered, and informed consent                            was obtained. Prior Anticoagulants: The patient has                            taken no previous anticoagulant or antiplatelet                            agents. ASA Grade Assessment: II - A patient with                            mild systemic disease. After reviewing the risks                            and benefits, the patient was deemed in                             satisfactory condition to undergo the procedure.                           After obtaining informed consent, the scope was                            passed under direct vision. Throughout the                            procedure, the patient's blood pressure, pulse, and                            oxygen saturations were monitored continuously. The                            [id number]-[id number] ([id number] Olympus duodenoscope was                            introduced through the mouth, and used to inject                            contrast into and used to inject contrast into the                            bile duct. The ERCP was accomplished without                            difficulty. The patient tolerated the procedure                            well. Scope In: Scope Out: Findings:      A scout film of the abdomen was obtained. Surgical clips, consistent       with a previous cholecystectomy, were seen in the area of the right       upper quadrant of the abdomen. The duodenoscope was advanced to the  region of the major papilla without detailed examination of the upper GI       tract. The major papilla showed evidence of previous, remote biliary       sphincterotomy but was otherwise normal. I used a 44 autotome over .035       Hydra wire to cannulate the bile duct and then injected contrast.       Cholangiogram showed a diffusely dilated extrahepatic biliary tree       containing 1 or 2 small mobile filling defects. CBD was 13 mm in       diameter. There were no obvious strictures. No biliary leaking. I felt       that the previous sphincterotomy was probably adequate to remove stones       and so I proceeded directly to sweeping of the bile duct with a 9 to 12       mm biliary retrieval balloon. I swept the extrahepatic biliary tree       several times and removed to small to medium sized brown stones and some       stone debris into the bile duct. There was no purulence. A completion,        occlusion cholangiogram showed no remaining filling defects. The main       pancreatic duct was never cannulated with a wire or injected with dye. Impression:               - Previous biliary sphincterotomy note.                           - Choledocholithiasis in slightly dilated                            extrahepatic bile duct. This was treated with                            balloon sweeping without need to extend the                            previous, remote biliary sphincterotomy. Recommendation:           - Observe overnight for complications. Repeat LFTS                            in the morning. I will advance diet now.                           - Continue IV abx until tomorrow morning and then                            it will probably be safe for d/c home on another 3                            day course of oral abx.                           - GI follow up PRN. Procedure Code(s):        --- Professional ---  [phone], Endoscopic retrograde                            cholangiopancreatography (ERCP); with removal of                            calculi/debris from biliary/pancreatic duct(s) Diagnosis Code(s):        --- Professional ---                           K80.50, Calculus of bile duct without cholangitis                            or cholecystitis without obstruction CPT copyright 2019 [employer]. All rights reserved. The codes documented in this report are preliminary and upon coder review may  be revised to meet current compliance requirements. Rachael Fee, MD 09/15/2021 2:01:59 PM This report has been signed electronically. Number of Addenda: 0

## 2021-09-15 NOTE — Progress Notes (Signed)
PROGRESS NOTE    Jean Bennett  XBD:[id number] DOB: 04-24-1961 DOA: 09/12/2021 PCP: Endocrinology, [facility]   Brief Narrative: 60 year old with past medical history significant for obesity, cholecystectomy who presents complaining of epigastric pain for 1 week.  He usually drinks 1 beer per day.  Patient was found to have borderline tachycardia, transaminases: AST 190 ALT 291, alkaline phosphatase 344, bilirubin 1.9. CTA chest,   abdomen pelvis; no acute intrathoracic abdominal or pelvic pathology.  No aortic aneurysm or dissection.  Cholecystectomy with dilation of the common bile duct and intrahepatic biliary tree.    Assessment & Plan:   Principal Problem:   Cholangitis Active Problems:   Bacterial cholangitis  1-Abdominal Pain/ CBD Dilation, Transaminases;  -CTA abdomen, pelvis: No acute intrathoracic, abdominal or pelvic pathology.  Cholecystectomy with dilation of the common bile duct and intrahepatic biliary trees. -Right upper quadrant ultrasound: Dilation of the common bile duct.  Mild intrahepatic biliary duct dilation.  Mild increased liver echogenicity. -Continue with Zosyn to cover for infection.  -Mild elevation of lipase.  -Patient has remain afebrile.  -ERCP 12/16. Today   Alcohol Use:  Continue with CIWA and thiamine, folic acid.   Obesity: Needs life style modification.   Hyperglycemia; A1c. 5.4   Estimated body mass index is 31.74 kg/m as calculated from the following:   Height as of this encounter: 5\' 1"  (1.549 m).   Weight as of this encounter: 76.2 kg.   DVT prophylaxis: SCD Code Status: Full code Family Communication: care discussed with patient  Disposition Plan:  Status is: Inpatient  Remains inpatient appropriate because: CBD obstruction, abdominal pain.         Consultants:  GI  Procedures:    Antimicrobials:  Zosyn   Subjective: She denies pain. Feeling well. Awaiting procedure  Objective: Vitals:   09/14/21 2019  09/15/21 0557 09/15/21 0723 09/15/21 1240  BP: 112/83 112/70 123/71 125/63  Pulse: 84 73 82 84  Resp: 17 18  19   Temp: 97.8 F (36.6 C)  97.6 F (36.4 C)   TempSrc: Oral     SpO2: 96% 98% 97% 99%  Weight:      Height:        Intake/Output Summary (Last 24 hours) at 09/15/2021 1301 Last data filed at 09/15/2021 0836 Gross per 24 hour  Intake 388.93 ml  Output --  Net 388.93 ml    Filed Weights   09/13/21 0042  Weight: 76.2 kg    Examination:  General exam: NAD Respiratory system: CTA Cardiovascular system: S 1, S 2 RRR Gastrointestinal system: BS present, soft, nt Central nervous system: alert, non focal.  Extremities: No edema   Data Reviewed: I have personally reviewed following labs and imaging studies  CBC: Recent Labs  Lab 09/13/21 0012 09/15/21 0621  WBC 11.8* 5.9  NEUTROABS 10.4*  --   HGB 13.9 13.3  HCT 41.4 39.5  MCV 96.1 95.6  PLT 273 266    Basic Metabolic Panel: Recent Labs  Lab 09/13/21 0012 09/14/21 0333 09/15/21 0621  NA 135 139 138  K 3.7 3.8 3.6  CL 100 104 108  CO2 27 27 21*  GLUCOSE 212* 103* 100*  BUN 10 9 9   CREATININE 1.04* 1.19* 1.02*  CALCIUM 9.0 9.2 8.5*    GFR: Estimated Creatinine Clearance: 56.2 mL/min (A) (by C-G formula based on SCr of 1.02 mg/dL (H)). Liver Function Tests: Recent Labs  Lab 09/13/21 0012 09/14/21 0333 09/15/21 0621  AST 190* 111* 35  ALT 291* 290* 168*  ALKPHOS 344* 324* 230*  BILITOT 1.9* 2.3* 1.0  PROT 7.4 7.0 6.1*  ALBUMIN 3.6 3.1* 2.9*    Recent Labs  Lab 09/13/21 0012  LIPASE 55*    No results for input(s): AMMONIA in the last 168 hours. Coagulation Profile: No results for input(s): INR, PROTIME in the last 168 hours. Cardiac Enzymes: No results for input(s): CKTOTAL, CKMB, CKMBINDEX, TROPONINI in the last 168 hours. BNP (last 3 results) No results for input(s): PROBNP in the last 8760 hours. HbA1C: Recent Labs    09/15/21 0148  HGBA1C 5.4   CBG: No results for  input(s): GLUCAP in the last 168 hours. Lipid Profile: No results for input(s): CHOL, HDL, LDLCALC, TRIG, CHOLHDL, LDLDIRECT in the last 72 hours. Thyroid Function Tests: No results for input(s): TSH, T4TOTAL, FREET4, T3FREE, THYROIDAB in the last 72 hours. Anemia Panel: No results for input(s): VITAMINB12, FOLATE, FERRITIN, TIBC, IRON, RETICCTPCT in the last 72 hours. Sepsis Labs: No results for input(s): PROCALCITON, LATICACIDVEN in the last 168 hours.  No results found for this or any previous visit (from the past 240 hour(s)).       Radiology Studies: No results found.      Scheduled Meds:  [MAR Hold] folic acid  1 mg Oral Daily   [MAR Hold] indomethacin  100 mg Rectal Once   [MAR Hold] multivitamin with minerals  1 tablet Oral Daily   [MAR Hold] pantoprazole  40 mg Oral Daily   [MAR Hold] thiamine  100 mg Oral Daily   Or   [MAR Hold] thiamine  100 mg Intravenous Daily   Continuous Infusions:  sodium chloride 10 mL/hr at 09/14/21 1638   lactated ringers 20 mL/hr at 09/15/21 1248   [MAR Hold] piperacillin-tazobactam (ZOSYN)  IV 3.375 g (09/15/21 0519)     LOS: 2 days    Time spent: 35 minutes.     Alba Cory, MD [facility]   If 7PM-7AM, please contact night-coverage [email or web address]  09/15/2021, 1:01 PM

## 2021-09-15 NOTE — Anesthesia Preprocedure Evaluation (Signed)
Anesthesia Evaluation  Patient identified by MRN, date of birth, ID band Patient awake    Reviewed: Allergy & Precautions, NPO status , Patient's Chart, lab work & pertinent test results  History of Anesthesia Complications Negative for: history of anesthetic complications  Airway Mallampati: I  TM Distance: >3 FB Neck ROM: Full    Dental  (+) Dental Advisory Given, Teeth Intact   Pulmonary neg pulmonary ROS,    Pulmonary exam normal        Cardiovascular negative cardio ROS Normal cardiovascular exam     Neuro/Psych negative neurological ROS     GI/Hepatic Neg liver ROS, GERD  ,cholangitis   Endo/Other  negative endocrine ROS  Renal/GU negative Renal ROS  negative genitourinary   Musculoskeletal negative musculoskeletal ROS (+)   Abdominal   Peds  Hematology negative hematology ROS (+)   Anesthesia Other Findings   Reproductive/Obstetrics                            Anesthesia Physical Anesthesia Plan  ASA: 2  Anesthesia Plan: General   Post-op Pain Management: Minimal or no pain anticipated   Induction: Intravenous  PONV Risk Score and Plan: 3 and Ondansetron, Dexamethasone, Treatment may vary due to age or medical condition and Midazolam  Airway Management Planned: Oral ETT  Additional Equipment: None  Intra-op Plan:   Post-operative Plan: Extubation in OR  Informed Consent: I have reviewed the patients History and Physical, chart, labs and discussed the procedure including the risks, benefits and alternatives for the proposed anesthesia with the patient or authorized representative who has indicated his/her understanding and acceptance.     Dental advisory given  Plan Discussed with:   Anesthesia Plan Comments:         Anesthesia Quick Evaluation

## 2021-09-15 NOTE — Transfer of Care (Signed)
Immediate Anesthesia Transfer of Care Note  Patient: Baruch Gouty  Procedure(s) Performed: ENDOSCOPIC RETROGRADE CHOLANGIOPANCREATOGRAPHY (ERCP) WITH PROPOFOL REMOVAL OF STONES  Patient Location: PACU  Anesthesia Type:General  Level of Consciousness: drowsy  Airway & Oxygen Therapy: Patient Spontanous Breathing  Post-op Assessment: Report given to RN and Post -op Vital signs reviewed and stable  Post vital signs: Reviewed and stable  Last Vitals:  Vitals Value Taken Time  BP    Temp    Pulse 85 09/15/21 1406  Resp    SpO2 96 % 09/15/21 1406  Vitals shown include unvalidated device data.  Last Pain:  Vitals:   09/15/21 1240  TempSrc:   PainSc: 0-No pain         Complications: No notable events documented.

## 2021-09-15 NOTE — Interval H&P Note (Signed)
History and Physical Interval Note:  09/15/2021 12:48 PM  Jean Bennett  has presented today for surgery, with the diagnosis of dilated bile ducts.  The various methods of treatment have been discussed with the patient and family. After consideration of risks, benefits and other options for treatment, the patient has consented to  Procedure(s): ENDOSCOPIC RETROGRADE CHOLANGIOPANCREATOGRAPHY (ERCP) WITH PROPOFOL (N/A) as a surgical intervention.  The patient's history has been reviewed, patient examined, no change in status, stable for surgery.  I have reviewed the patient's chart and labs.  Questions were answered to the patient's satisfaction.     Rachael Fee

## 2021-09-15 NOTE — Anesthesia Postprocedure Evaluation (Signed)
Anesthesia Post Note  Patient: Jean Bennett  Procedure(s) Performed: ENDOSCOPIC RETROGRADE CHOLANGIOPANCREATOGRAPHY (ERCP) WITH PROPOFOL REMOVAL OF STONES     Patient location during evaluation: PACU Anesthesia Type: General Level of consciousness: awake and alert Pain management: pain level controlled Vital Signs Assessment: post-procedure vital signs reviewed and stable Respiratory status: spontaneous breathing, nonlabored ventilation and respiratory function stable Cardiovascular status: blood pressure returned to baseline and stable Postop Assessment: no apparent nausea or vomiting Anesthetic complications: no   No notable events documented.  Last Vitals:  Vitals:   09/15/21 1444 09/15/21 1510  BP: 121/74 121/86  Pulse: 74 76  Resp: 17 18  Temp: 36.7 C 36.5 C  SpO2: 96% 94%    Last Pain:  Vitals:   09/15/21 1444  TempSrc:   PainSc: 0-No pain                 Lucretia Kern

## 2021-09-15 NOTE — Anesthesia Procedure Notes (Signed)
Procedure Name: Intubation Date/Time: 09/15/2021 1:15 PM Performed by: Vonna Drafts, CRNA Pre-anesthesia Checklist: Patient identified, Emergency Drugs available, Suction available and Patient being monitored Patient Re-evaluated:Patient Re-evaluated prior to induction Oxygen Delivery Method: Circle system utilized Preoxygenation: Pre-oxygenation with 100% oxygen Induction Type: IV induction Ventilation: Mask ventilation without difficulty Laryngoscope Size: Mac and 4 Grade View: Grade I Tube type: Oral Tube size: 7.5 mm Number of attempts: 1 Airway Equipment and Method: Stylet and Oral airway Placement Confirmation: ETT inserted through vocal cords under direct vision, positive ETCO2 and breath sounds checked- equal and bilateral Secured at: 22 cm Tube secured with: Tape Dental Injury: Teeth and Oropharynx as per pre-operative assessment

## 2021-09-16 DIAGNOSIS — K8050 Calculus of bile duct without cholangitis or cholecystitis without obstruction: Secondary | ICD-10-CM

## 2021-09-16 LAB — COMPREHENSIVE METABOLIC PANEL
ALT: 141 U/L — ABNORMAL HIGH (ref 0–44)
AST: 25 U/L (ref 15–41)
Albumin: 3 g/dL — ABNORMAL LOW (ref 3.5–5.0)
Alkaline Phosphatase: 235 U/L — ABNORMAL HIGH (ref 38–126)
Anion gap: 8 (ref 5–15)
BUN: 12 mg/dL (ref 6–20)
CO2: 22 mmol/L (ref 22–32)
Calcium: 9.1 mg/dL (ref 8.9–10.3)
Chloride: 106 mmol/L (ref 98–111)
Creatinine, Ser: 0.88 mg/dL (ref 0.44–1.00)
GFR, Estimated: >60 mL/min (ref >60)
Glucose, Bld: 140 mg/dL — ABNORMAL HIGH (ref 70–99)
Potassium: 4 mmol/L (ref 3.5–5.1)
Sodium: 136 mmol/L (ref 135–145)
Total Bilirubin: 0.7 mg/dL (ref 0.3–1.2)
Total Protein: 6.5 g/dL (ref 6.5–8.1)

## 2021-09-16 MED ORDER — AMOXICILLIN-POT CLAVULANATE 875-125 MG PO TABS: 1.0000 | ORAL_TABLET | Freq: Two times a day (BID) | ORAL | 0 refills | Status: AC

## 2021-09-16 MED ORDER — PANTOPRAZOLE SODIUM 40 MG PO TBEC: 40.0000 mg | DELAYED_RELEASE_TABLET | Freq: Every day | ORAL | 0 refills | Status: AC

## 2021-09-16 NOTE — Social Work (Signed)
°  Transition of Care [facility]) Screening Note   Patient Details  Name: Jean Bennett Date of Birth: 15-Apr-1961   Transition of Care [facility]) CM/SW Contact:    Jimmy Picket, LCSW Phone Number: 09/16/2021, 10:27 AM    Transition of Care Department [facility]) has reviewed patient and no TOC needs have been identified at this time. We will continue to monitor patient advancement through interdisciplinary progression rounds. If new patient transition needs arise, please place a TOC consult.

## 2021-09-16 NOTE — Discharge Summary (Signed)
Physician Discharge Summary  Jean Bennett [id number] DOB: 1961/09/25 DOA: 09/12/2021  PCP: Endocrinology, [facility]  Admit date: 09/12/2021 Discharge date: 09/16/2021  Admitted From: Home  Disposition: Home   Recommendations for Outpatient Follow-up:  Follow up with PCP in 1-2 weeks Please obtain BMP/CBC in one week Needs LFT in 1 week.     Discharge Condition: Stable.  CODE STATUS:Full code Diet recommendation: Heart Healthy   Brief/Interim Summary: 60 year old with past medical history significant for obesity, cholecystectomy who presents complaining of epigastric pain for 1 week.  He usually drinks 1 beer per day.  Patient was found to have borderline tachycardia, transaminases: AST 190 ALT 291, alkaline phosphatase 344, bilirubin 1.9. CTA chest,    abdomen pelvis; no acute intrathoracic abdominal or pelvic pathology.  No aortic aneurysm or dissection.  Cholecystectomy with dilation of the common bile duct and intrahepatic biliary tree.     1-Abdominal Pain/ CBD Dilation , Transaminases; Choledocholithiasis.  -CTA abdomen, pelvis: No acute intrathoracic, abdominal or pelvic pathology.  Cholecystectomy with dilation of the common bile duct and intrahepatic biliary trees. -Right upper quadrant ultrasound: Dilation of the common bile duct.  Mild intrahepatic biliary duct dilation.  Mild increased liver echogenicity. -Continue with Zosyn to cover for infection.  -Mild elevation of lipase.  -Patient has remain afebrile.  -Underwent ERCP 12/16: previous Biliary sphincterotomy noted.  Choledocholithiasis in slightly dilated extrahepatic bile duct.  Treated with balloon sweeping without need to extend the previous remote sphincterotomy. Patient was observed overnight, liver function test trending down.  Plan to discharge on 3 days of Augmentin.  She will need liver function tests within a week.    Alcohol Use:  Continue with CIWA and thiamine, folic acid.  No evidence of  withdrawal.  Counseling provided.   Obesity: Needs life style modification.    Hyperglycemia; A1c. 5.4    Discharge Diagnoses:  Principal Problem:   Cholangitis Active Problems:   Bacterial cholangitis   Choledocholithiasis with obstruction    Discharge Instructions  Discharge Instructions     Diet - low sodium heart healthy   Complete by: As directed    Increase activity slowly   Complete by: As directed       Allergies as of 09/16/2021   No Known Allergies      Medication List     TAKE these medications    amoxicillin-clavulanate 875-125 MG tablet Commonly known as: Augmentin Take 1 tablet by mouth 2 (two) times daily for 3 days.   pantoprazole 40 MG tablet Commonly known as: PROTONIX Take 1 tablet (40 mg total) by mouth daily. Start taking on: September 17, 2021   VITAMIN B-12 PO Take 1 tablet by mouth daily.   VITAMIN D3 PO Take 1 capsule by mouth daily.        Follow-up Information     Endocrinology, [facility] Follow up in 1 week(s).   Why: You need Liver enzymes check. Contact information: [address] [place] [address] [phone]                No Known Allergies  Consultations: GI   Procedures/Studies: DG Chest 2 View  Result Date: 09/13/2021 CLINICAL DATA:  Chest pain EXAM: CHEST - 2 VIEW COMPARISON:  12/01/2019 FINDINGS: Lungs are clear.  No pleural effusion or pneumothorax. The heart is normal in size. Visualized osseous structures are within normal limits. Cholecystectomy clips. IMPRESSION: Normal chest radiographs. Electronically Signed   By: Julian Hy M.D.   On: 09/13/2021 01:05  DG ERCP  Result Date: 09/15/2021 CLINICAL DATA:  60 year old female with a history of choledocholithiasis EXAM: ERCP TECHNIQUE: Multiple spot images obtained with the fluoroscopic device and submitted for interpretation post-procedure. FLUOROSCOPY TIME:  Fluoroscopy Time:  2 minutes 36 seconds Radiation Exposure  Index (if provided by the fluoroscopic device): 35.8 mGy Number of Acquired Spot Images: 0 COMPARISON:  None. FINDINGS: Two intraoperative saved images and a fluoro save are submitted for review. The images demonstrate a flexible duodenal scope in the descending duodenum with wire cannulation of the common bile duct. Subsequent images demonstrate cholangiogram showing mild extrahepatic biliary ductal dilatation. IMPRESSION: ERCP. Mild extrahepatic biliary ductal dilatation. These images were submitted for radiologic interpretation only. Please see the procedural report for the amount of contrast and the fluoroscopy time utilized. Electronically Signed   By: Jacqulynn Cadet M.D.   On: 09/15/2021 14:29   CT Angio Chest/Abd/Pel for Dissection W and/or Wo Contrast  Result Date: 09/13/2021 CLINICAL DATA:  Chest and back pain.  Concern for aortic dissection. EXAM: CT ANGIOGRAPHY CHEST, ABDOMEN AND PELVIS TECHNIQUE: Non-contrast CT of the chest was initially obtained. Multidetector CT imaging through the chest, abdomen and pelvis was performed using the standard protocol during bolus administration of intravenous contrast. Multiplanar reconstructed images and MIPs were obtained and reviewed to evaluate the vascular anatomy. CONTRAST:  139mL OMNIPAQUE IOHEXOL 350 MG/ML SOLN COMPARISON:  CT of the abdomen pelvis dated 08/29/2013. Chest radiograph dated 09/13/2021. FINDINGS: CTA CHEST FINDINGS Cardiovascular: There is no cardiomegaly or pericardial effusion the thoracic aorta is unremarkable. The origins of the great vessels of the aortic arch appear patent. Evaluation of the pulmonary arteries is limited due to respiratory motion artifact. No pulmonary artery embolus item Mediastinum/Nodes: No hilar or mediastinal adenopathy. The esophagus is grossly unremarkable. No mediastinal fluid collection. Lungs/Pleura: Minimal bibasilar atelectasis. No focal consolidation, pleural effusion, or pneumothorax. The central airways  are patent. Musculoskeletal: Bilateral breast implants. No acute osseous pathology. Review of the MIP images confirms the above findings. CTA ABDOMEN AND PELVIS FINDINGS VASCULAR Aorta: Normal caliber aorta without aneurysm, dissection, vasculitis or significant stenosis. Celiac: Patent without evidence of aneurysm, dissection, vasculitis or significant stenosis. SMA: Patent without evidence of aneurysm, dissection, vasculitis or significant stenosis. Renals: Both renal arteries are patent without evidence of aneurysm, dissection, vasculitis, fibromuscular dysplasia or significant stenosis. IMA: Patent without evidence of aneurysm, dissection, vasculitis or significant stenosis. Inflow: Patent without evidence of aneurysm, dissection, vasculitis or significant stenosis. Veins: No obvious venous abnormality within the limitations of this arterial phase study. Review of the MIP images confirms the above findings. NON-VASCULAR No intra-abdominal free air or free fluid. Hepatobiliary: Slight heterogeneous enhancement of the liver. There is a 2 cm left hepatic cyst as well as smaller additional cysts, present on the prior CT but slightly increased in size since 2014. There is dilatation of the common bile duct and intrahepatic biliary trees, likely post cholecystectomy. The common bile duct measures approximately 17 mm in diameter. No retained calcified stone noted in the central CBD. There is however faint soft tissue density in the region of the MP of (167/6 and coronal 70/9) which may represent ampullary tissue or debris. This can be better evaluated with MRI/MRCP, possibly on a nonemergent/outpatient basis. Pancreas: Unremarkable. No pancreatic ductal dilatation or surrounding inflammatory changes. Spleen: Normal in size without focal abnormality. Adrenals/Urinary Tract: The adrenal glands unremarkable. The kidneys, visualized ureters, and urinary bladder appear unremarkable. Stomach/Bowel: Small scattered sigmoid  diverticula without active inflammatory changes. There is no  bowel obstruction or active inflammation. The appendix is normal. Lymphatic: No adenopathy. Reproductive: The uterus and ovaries are grossly unremarkable. No adnexal masses. Other: None Musculoskeletal: No acute or significant osseous findings. Review of the MIP images confirms the above findings. IMPRESSION: 1. No acute intrathoracic, abdominal, or pelvic pathology. No aortic aneurysm or dissection. 2. Cholecystectomy with dilatation of the common bile duct and intrahepatic biliary trees. MRI/MRCP may provide better evaluation, possibly on a nonemergent/outpatient basis. 3. Small scattered sigmoid diverticula without active inflammatory changes. No bowel obstruction. Normal appendix. Electronically Signed   By: Elgie Collard M.D.   On: 09/13/2021 01:52   US Abdomen Limited RUQ (LIVER/GB)  Result Date: 09/13/2021 CLINICAL DATA:  Common bile duct dilatation on CT. EXAM: ULTRASOUND ABDOMEN LIMITED RIGHT UPPER QUADRANT COMPARISON:  CT 09/13/2021 FINDINGS: Gallbladder: Postcholecystectomy. Common bile duct: Diameter: Dilated 9 mm. Liver: Mild intrahepatic biliary duct dilatation similar to comparison CT. Several cystic lesions in liver. Mild increased liver echogenicity portal vein is patent on color Doppler imaging with normal direction of blood flow towards the liver. Other: None. IMPRESSION: 1. Dilatation of the common bile duct as seen on comparison CT. 2. Mild intrahepatic biliary duct dilatation seen on comparison CT. 3. Mild increased liver echogenicity. Electronically Signed   By: Genevive Bi M.D.   On: 09/13/2021 13:03    Subjective: She denies abdominal pain. Tolerating diet   Discharge Exam: Vitals:   09/16/21 0457 09/16/21 0828  BP: 107/65 109/69  Pulse: 77 89  Resp: 18 19  Temp: 98.1 F (36.7 C) 97.6 F (36.4 C)  SpO2: 95% 99%     General: Pt is alert, awake, not in acute distress Cardiovascular: RRR, S1/S2 +, no  rubs, no gallops Respiratory: CTA bilaterally, no wheezing, no rhonchi Abdominal: Soft, NT, ND, bowel sounds + Extremities: no edema, no cyanosis    The results of significant diagnostics from this hospitalization (including imaging, microbiology, ancillary and laboratory) are listed below for reference.     Microbiology: No results found for this or any previous visit (from the past 240 hour(s)).   Labs: BNP (last 3 results) No results for input(s): BNP in the last 8760 hours. Basic Metabolic Panel: Recent Labs  Lab 09/13/21 0012 09/14/21 0333 09/15/21 0621 09/16/21 0136  NA 135 139 138 136  K 3.7 3.8 3.6 4.0  CL 100 104 108 106  CO2 27 27 21* 22  GLUCOSE 212* 103* 100* 140*  BUN 10 9 9 12   CREATININE 1.04* 1.19* 1.02* 0.88  CALCIUM 9.0 9.2 8.5* 9.1   Liver Function Tests: Recent Labs  Lab 09/13/21 0012 09/14/21 0333 09/15/21 0621 09/16/21 0136  AST 190* 111* 35 25  ALT 291* 290* 168* 141*  ALKPHOS 344* 324* 230* 235*  BILITOT 1.9* 2.3* 1.0 0.7  PROT 7.4 7.0 6.1* 6.5  ALBUMIN 3.6 3.1* 2.9* 3.0*   Recent Labs  Lab 09/13/21 0012  LIPASE 55*   No results for input(s): AMMONIA in the last 168 hours. CBC: Recent Labs  Lab 09/13/21 0012 09/15/21 0621  WBC 11.8* 5.9  NEUTROABS 10.4*  --   HGB 13.9 13.3  HCT 41.4 39.5  MCV 96.1 95.6  PLT 273 266   Cardiac Enzymes: No results for input(s): CKTOTAL, CKMB, CKMBINDEX, TROPONINI in the last 168 hours. BNP: Invalid input(s): POCBNP CBG: No results for input(s): GLUCAP in the last 168 hours. D-Dimer No results for input(s): DDIMER in the last 72 hours. Hgb A1c Recent Labs    09/15/21 0148  HGBA1C 5.4   Lipid Profile No results for input(s): CHOL, HDL, LDLCALC, TRIG, CHOLHDL, LDLDIRECT in the last 72 hours. Thyroid function studies No results for input(s): TSH, T4TOTAL, T3FREE, THYROIDAB in the last 72 hours.  Invalid input(s): FREET3 Anemia work up No results for input(s): VITAMINB12, FOLATE,  FERRITIN, TIBC, IRON, RETICCTPCT in the last 72 hours. Urinalysis No results found for: COLORURINE, APPEARANCEUR, LABSPEC, [place], GLUCOSEU, HGBUR, BILIRUBINUR, KETONESUR, PROTEINUR, UROBILINOGEN, NITRITE, LEUKOCYTESUR Sepsis Labs Invalid input(s): PROCALCITONIN,  WBC,  LACTICIDVEN Microbiology No results found for this or any previous visit (from the past 240 hour(s)).   Time coordinating discharge: 40 minutes  SIGNED:   Elmarie Shiley, MD  [facility]

## 2021-09-16 NOTE — Progress Notes (Signed)
[facility] Progress Note    Since last GI note: ERCP yesterday, see full report in epic.  Feels fine, sitting up in chair.  No abd pains.  Objective: Vital signs in last 24 hours: Temp:  [97.6 F (36.4 C)-98.1 F (36.7 C)] 97.6 F (36.4 C) (12/17 0828) Pulse Rate:  [74-89] 89 (12/17 0828) Resp:  [15-19] 19 (12/17 0828) BP: (98-125)/(63-86) 109/69 (12/17 0828) SpO2:  [94 %-99 %] 99 % (12/17 0828) Last BM Date: 09/14/21 General: alert and oriented times 3 Heart: regular rate and rythm Abdomen: soft, non-tender, non-distended, normal bowel sounds   Lab Results: Recent Labs    09/15/21 0621  WBC 5.9  HGB 13.3  PLT 266  MCV 95.6   Recent Labs    09/14/21 0333 09/15/21 0621 09/16/21 0136  NA 139 138 136  K 3.8 3.6 4.0  CL 104 108 106  CO2 27 21* 22  GLUCOSE 103* 100* 140*  BUN 9 9 12   CREATININE 1.19* 1.02* 0.88  CALCIUM 9.2 8.5* 9.1   Recent Labs    09/14/21 0333 09/15/21 0621 09/16/21 0136  PROT 7.0 6.1* 6.5  ALBUMIN 3.1* 2.9* 3.0*  AST 111* 35 25  ALT 290* 168* 141*  ALKPHOS 324* 230* 235*  BILITOT 2.3* 1.0 0.7    Medications: Scheduled Meds:  folic acid  1 mg Oral Daily   multivitamin with minerals  1 tablet Oral Daily   pantoprazole  40 mg Oral Daily   thiamine  100 mg Oral Daily   Or   thiamine  100 mg Intravenous Daily   Continuous Infusions:  sodium chloride 10 mL/hr at 09/14/21 1638   piperacillin-tazobactam (ZOSYN)  IV 3.375 g (09/16/21 0635)   PRN Meds:.acetaminophen **OR** acetaminophen, ketorolac, LORazepam **OR** LORazepam, ondansetron **OR** ondansetron (ZOFRAN) IV  Assessment/Plan: 60 y.o. female choledocholithiasis  Recovering well after ERCP yesterday.  She is ok for d/c today, should complete 3 days oral abx.  She can follow up with her [facility] team, PRN.   41, MD  09/16/2021, 10:27 AM [facility] Pager [phone]

## 2021-09-17 ENCOUNTER — Encounter (HOSPITAL_COMMUNITY): Payer: Self-pay | Admitting: Gastroenterology

## 2021-10-23 ENCOUNTER — Telehealth: Payer: Self-pay | Admitting: Gastroenterology

## 2021-10-23 NOTE — Telephone Encounter (Signed)
The pt was hospitalized in December 2022 for choledocholithiasis.  ERCP done by Dr Christella Hartigan.  The pt was sent home with 3 days of abx.  She has done well until this past weekend.  She had what she describes as "severe abd pain" to the point that she was screaming.  Her husband wanted to take her to the ED but the pt refused.  She took a "pain pill" and went to sleep.  She woke up Sunday and the pain had resolved but her upper abd where her bra sits is still very tender and sore.  She has been scheduled to see Mike Gip PA tomorrow at 1130 am. She was told if the pain returns tonight she should go to the ED for eval.  The pt agreed.  Amy do you want any labs or imaging done before she comes in tomorrow?

## 2021-10-23 NOTE — Telephone Encounter (Signed)
After looking at the hospital notes, it appears patient saw Dr. Christella Hartigan, not Dr. Adela Lank.  Will forward to Dr. Christella Hartigan' nurse.

## 2021-10-23 NOTE — Telephone Encounter (Signed)
Inbound call from patient returning your call. 

## 2021-10-23 NOTE — Telephone Encounter (Signed)
Left message on machine to call back  

## 2021-10-23 NOTE — Telephone Encounter (Signed)
Patient called this morning.  She saw Judson Roch and Dr. Havery Moros when she was at [facility] in December.  She has not been to this office before.  This weekend she has been experiencing extreme pain under her ribs and lower back and she says it definitely feels more than just indigestion.  She is leary about eating anything.  She would like to see the doctor today, if possible, or at least be given advice as to what she needs to do.  Please call patient and advise.  Thank you.

## 2021-10-24 ENCOUNTER — Other Ambulatory Visit (INDEPENDENT_AMBULATORY_CARE_PROVIDER_SITE_OTHER): Payer: 59

## 2021-10-24 ENCOUNTER — Encounter: Payer: Self-pay | Admitting: Physician Assistant

## 2021-10-24 ENCOUNTER — Telehealth: Payer: Self-pay

## 2021-10-24 ENCOUNTER — Ambulatory Visit: Payer: 59 | Admitting: Physician Assistant

## 2021-10-24 VITALS — BP 100/60 | HR 82 | Ht 61.0 in | Wt 165.0 lb

## 2021-10-24 DIAGNOSIS — R110 Nausea: Secondary | ICD-10-CM | POA: Diagnosis not present

## 2021-10-24 DIAGNOSIS — R109 Unspecified abdominal pain: Secondary | ICD-10-CM

## 2021-10-24 DIAGNOSIS — K8050 Calculus of bile duct without cholangitis or cholecystitis without obstruction: Secondary | ICD-10-CM

## 2021-10-24 LAB — CBC WITH DIFFERENTIAL/PLATELET
Basophils Absolute: 0 10*3/uL (ref 0.0–0.1)
Basophils Relative: 0.4 % (ref 0.0–3.0)
Eosinophils Absolute: 0.1 10*3/uL (ref 0.0–0.7)
Eosinophils Relative: 1.6 % (ref 0.0–5.0)
HCT: 41.2 % (ref 36.0–46.0)
Hemoglobin: 13.6 g/dL (ref 12.0–15.0)
Lymphocytes Relative: 37.2 % (ref 12.0–46.0)
Lymphs Abs: 2.3 10*3/uL (ref 0.7–4.0)
MCHC: 33 g/dL (ref 30.0–36.0)
MCV: 95.4 fl (ref 78.0–100.0)
Monocytes Absolute: 0.5 10*3/uL (ref 0.1–1.0)
Monocytes Relative: 8.9 % (ref 3.0–12.0)
Neutro Abs: 3.2 10*3/uL (ref 1.4–7.7)
Neutrophils Relative %: 51.9 % (ref 43.0–77.0)
Platelets: 226 10*3/uL (ref 150.0–400.0)
RBC: 4.32 Mil/uL (ref 3.87–5.11)
RDW: 12.1 % (ref 11.5–15.5)
WBC: 6.1 10*3/uL (ref 4.0–10.5)

## 2021-10-24 LAB — COMPREHENSIVE METABOLIC PANEL
ALT: 177 U/L — ABNORMAL HIGH (ref 0–35)
AST: 38 U/L — ABNORMAL HIGH (ref 0–37)
Albumin: 4.2 g/dL (ref 3.5–5.2)
Alkaline Phosphatase: 238 U/L — ABNORMAL HIGH (ref 39–117)
BUN: 10 mg/dL (ref 6–23)
CO2: 29 mEq/L (ref 19–32)
Calcium: 10.1 mg/dL (ref 8.4–10.5)
Chloride: 103 mEq/L (ref 96–112)
Creatinine, Ser: 0.85 mg/dL (ref 0.40–1.20)
GFR: 75.63 mL/min (ref >60.00)
Glucose, Bld: 76 mg/dL (ref 70–99)
Potassium: 3.6 mEq/L (ref 3.5–5.1)
Sodium: 140 mEq/L (ref 135–145)
Total Bilirubin: 0.8 mg/dL (ref 0.2–1.2)
Total Protein: 7.6 g/dL (ref 6.0–8.3)

## 2021-10-24 LAB — AMYLASE: Amylase: 40 U/L (ref 27–131)

## 2021-10-24 LAB — LIPASE: Lipase: 54 U/L (ref 11.0–59.0)

## 2021-10-24 NOTE — Telephone Encounter (Signed)
-----   Message from Sammuel Cooper, [place] sent at 10/24/2021  4:11 PM EST ----- Sounds good- thanks  Beth,, please call patient and let her know that her labs do not show any signs of pancreatitis which is good, her white count is normal and her LFTs are up a bit.  I spoke with Dr. Christella Hartigan and as her LFTs are not severely elevated we would like to proceed with MRI/MRCP first before he would make decision about another ERCP.  Please get her scheduled for MRI/MRCP as soon as possible, hopefully within the next couple of days. Patient should be aware that if she has another episode of prolonged severe pain like she had on Friday when this started that she should come to the emergency room at [facility] or [facility]  for evaluation Thanks, let me know what day you can get the MRCP scheduled for   ----- Message ----- From: Rachael Fee, MD Sent: 10/24/2021   3:07 PM EST To: Sammuel Cooper, PA-C  Not high enough for me to go directly to ERCP.  Lets get an MRCP and act on that result. thanks

## 2021-10-24 NOTE — Progress Notes (Signed)
I agree with the agbove note, plan 

## 2021-10-24 NOTE — Progress Notes (Signed)
Subjective:    Patient ID: Jean Bennett, female    DOB: 1961/08/29, 61 y.o.   MRN: [id number]  HPI [name] is a pleasant 61 year old white female, recently established with Dr. Havery Moros when she was seen in the hospital 09/13/2021 with acute upper abdominal pain radiating to the back, and elevated LFTs.  Patient has history of very remote cholecystectomy done almost 30 years ago, and had required ERCP with stone extraction shortly thereafter.  CT of the abdomen pelvis at that time showed a dilated common bile duct to 17 mm.  There was no retained calcified stones seen in the central CBD however faint soft tissue density was noted in the region of the MP which may represent ampullary tissue or debris.  Also noted a 2 cm left hepatic cyst as well as smaller additional cysts, patient is status postcholecystectomy.  Initial LFTs with T bili 2.3/alk phos 324/AST 111 and ALT 290, WBC 11.8.  She underwent ERCP on 1216 with Dr. Ardis Hughs.  Found to have a CBD of 13 mm, her prior sphincterotomy was felt to have been adequate, 1-2 small filling defects were noted and duct was balloon sweeped and cleared. LFTs had significantly improved on 09/16/2021 with T bili 0.7/AST 25/ALT 141 and alk phos 235.  Patient says that she felt well and did not have any problems until about 4 days ago when she had another acute episode of severe pain located in the epigastrium, radiating around and through into her back.  She felt nauseated and forced herself to vomit.  Pain lasted for about 4 hours, when she finally took a medication for pain and then went to bed.  She says her husband wanted her to go to the emergency room but she was trying to avoid that.  By about midnight that night 8 hours later the pain had finally resolved. Since then she has not had any more severe pain but has remained "sore and tender "across the upper abdomen.  She has been eating light, no nausea or vomiting, no fever or chills.  Review of Systems  Pertinent positive and negative review of systems were noted in the above HPI section.  All other review of systems was otherwise negative.   Outpatient Encounter Medications as of 10/24/2021  Medication Sig   Cholecalciferol (VITAMIN D3 PO) Take 1 capsule by mouth daily.   Cyanocobalamin (VITAMIN B-12 PO) Take 1 tablet by mouth daily.   pantoprazole (PROTONIX) 40 MG tablet Take 1 tablet (40 mg total) by mouth daily.   No facility-administered encounter medications on file as of 10/24/2021.   No Known Allergies Patient Active Problem List   Diagnosis Date Noted   Choledocholithiasis with obstruction    Bacterial cholangitis 09/13/2021   Cholangitis 09/13/2021   Biliary obstruction    Abnormal finding on GI tract imaging    Social History   Socioeconomic History   Marital status: Married    Spouse name: Not on file   Number of children: Not on file   Years of education: Not on file   Highest education level: Not on file  Occupational History   Not on file  Tobacco Use   Smoking status: Never   Smokeless tobacco: Never  Vaping Use   Vaping Use: Never used  Substance and Sexual Activity   Alcohol use: Yes    Alcohol/week: 5.0 standard drinks    Types: 5 Glasses of wine per week   Drug use: Never   Sexual activity: Not on file  Other Topics Concern   Not on file  Social History Narrative   Not on file   Social Determinants of Health   Financial Resource Strain: Not on file  Food Insecurity: Not on file  Transportation Needs: Not on file  Physical Activity: Not on file  Stress: Not on file  Social Connections: Not on file  Intimate Partner Violence: Not on file    [name]'s family history is not on file.      Objective:    Vitals:   10/24/21 1130  BP: 100/60  Pulse: 82    Physical Exam. Well-developed well-nourished WF in no acute distress.  Height, Weight, 165 BMI31.18  HEENT; nontraumatic normocephalic, EOMI, PE R LA, sclera anicteric. Oropharynx;  not examined today Neck; supple, no JVD Cardiovascular; regular rate and rhythm with S1-S2, no murmur rub or gallop Pulmonary; Clear bilaterally Abdomen; soft, she is tender in the epigastrium and mildly across the upper abdomen, no rebound nondistended, no palpable mass or hepatosplenomegaly, bowel sounds are active Rectal; not done today Skin; benign exam, no jaundice rash or appreciable lesions Extremities; no clubbing cyanosis or edema skin warm and dry Neuro/Psych; alert and oriented x4, grossly nonfocal mood and affect appropriate        Assessment & Plan:   #28 61 year old white female with recent hospitalization 09/13/2021 with acute epigastric pain radiating to the back, elevated LFTs and finding of choledocholithiasis on ERCP.  She was found to have 2 small filling defects which were cleared from the duct, and evidence of prior sphincterotomy which was felt to be adequate.  Patient has history of very remote cholecystectomy almost 30 years ago followed by ERCP with stone extraction.  Patient did well post ERCP until about 4 days ago when she had another episode of very similar pain with severe epigastric pain radiating into her back and around into her back associated with nausea.  Pain was intense and lasted for over 8 hours and then finally resolved.  No associated fever or chills, she has remained tender in her abdomen since that time Rule out recurrent choledocholithiasis, rule out mild biliary pancreatitis  #2 GERD stable-some increase in symptoms over the past few days.  Plan; CBC with differential, c-Met, amylase, lipase Patient instructed to eat small bland low-fat meals Depending on results of labs will determine need for MRCP versus repeat ERCP with sphincterotomy and stone extraction.  I have discussed with Dr. Ardis Hughs today. Patient knows to go to the emergency room in the interim if she has another episode of prolonged severe pain.    Jean Bennett Jean Harold  PA-C 10/24/2021   Cc: Endocrinology, [place]*

## 2021-10-24 NOTE — Patient Instructions (Signed)
If you are age 59 or younger, your body mass index should be between 19-25. Your Body mass index is 31.18 kg/m. If this is out of the aformentioned range listed, please consider follow up with your Primary Care Provider.  ________________________________________________________  The [facility] providers would like to encourage you to use [facility] to communicate with providers for non-urgent requests or questions.  Due to long hold times on the telephone, sending your provider a message by [facility] may be a faster and more efficient way to get a response.  Please allow 48 business hours for a response.  Please remember that this is for non-urgent requests.  _______________________________________________________  Your provider has requested that you go to the basement level for lab work before leaving today. Press "B" on the elevator. The lab is located at the first door on the left as you exit the elevator.  Follow a bland diet  We will contact you with your lab results and discuss the next step of your treatment.  If you pain gets worse, go to the ER.  Thank you for entrusting me with your care and choosing [facility].  Amy Esterwood, PA-C

## 2021-10-25 ENCOUNTER — Other Ambulatory Visit: Payer: Self-pay

## 2021-10-25 DIAGNOSIS — K8033 Calculus of bile duct with acute cholangitis with obstruction: Secondary | ICD-10-CM

## 2021-10-25 DIAGNOSIS — R7989 Other specified abnormal findings of blood chemistry: Secondary | ICD-10-CM

## 2021-10-25 DIAGNOSIS — Z8719 Personal history of other diseases of the digestive system: Secondary | ICD-10-CM

## 2021-10-25 NOTE — Progress Notes (Signed)
Agree with the assessment and plan as outlined. Just seeing this note now. Repeat labs show elevated LAEs similar to prior elevation. No evidence of pancreatitis or leukocytosis. MRCP is planned to further evaluate, appreciate Dr. Ardis Hughs input for this case.

## 2021-10-25 NOTE — Telephone Encounter (Signed)
Spoke with the patient. Explained the plan of care. She is presently not having any severe pain in her abdomen. She is "sore" only.  She agrees to the plan of care.  I have reached out to our insurance/referral coordinators about the PA with her [employer].

## 2021-10-25 NOTE — Telephone Encounter (Signed)
She has been contacted with the appointment for the MRI/MRCP which is Friday 11/27/21 at 9:00 pm.  Instructed to arrive through the ER at 8:30 pm. Advise the Registrar she is there for an Outpatient MRI. She is instructed to begin fasting at 5 pm. Per our [facility] her insurance does not require prior authorization. Dr Christella Hartigan I am letting you know of this appointment because Amy Monica Becton is out of the office on Friday and this is an after hours imaging at 9:00 pm.

## 2021-10-27 ENCOUNTER — Other Ambulatory Visit: Payer: Self-pay | Admitting: Physician Assistant

## 2021-10-27 ENCOUNTER — Other Ambulatory Visit: Payer: Self-pay

## 2021-10-27 ENCOUNTER — Ambulatory Visit (HOSPITAL_COMMUNITY)
Admission: RE | Admit: 2021-10-27 | Discharge: 2021-10-27 | Disposition: A | Discharge status: Home / Self Care | Payer: 59 | Source: Ambulatory Visit | Attending: Physician Assistant | Admitting: Physician Assistant

## 2021-10-27 DIAGNOSIS — K8033 Calculus of bile duct with acute cholangitis with obstruction: Secondary | ICD-10-CM

## 2021-10-27 DIAGNOSIS — Z8719 Personal history of other diseases of the digestive system: Secondary | ICD-10-CM | POA: Diagnosis present

## 2021-10-27 DIAGNOSIS — R7989 Other specified abnormal findings of blood chemistry: Secondary | ICD-10-CM | POA: Insufficient documentation

## 2021-10-27 MED ORDER — GADOBUTROL 1 MMOL/ML IV SOLN
7.0000 mL | Freq: Once | INTRAVENOUS | Status: AC | PRN
  Administered 2021-10-27: 7 mL via INTRAVENOUS

## 2021-10-30 ENCOUNTER — Other Ambulatory Visit: Payer: Self-pay

## 2021-10-30 DIAGNOSIS — R7989 Other specified abnormal findings of blood chemistry: Secondary | ICD-10-CM

## 2021-10-31 ENCOUNTER — Other Ambulatory Visit (INDEPENDENT_AMBULATORY_CARE_PROVIDER_SITE_OTHER): Payer: 59

## 2021-10-31 DIAGNOSIS — R7989 Other specified abnormal findings of blood chemistry: Secondary | ICD-10-CM | POA: Diagnosis not present

## 2021-10-31 LAB — HEPATIC FUNCTION PANEL
ALT: 41 U/L — ABNORMAL HIGH (ref 0–35)
AST: 21 U/L (ref 0–37)
Albumin: 4.3 g/dL (ref 3.5–5.2)
Alkaline Phosphatase: 127 U/L — ABNORMAL HIGH (ref 39–117)
Bilirubin, Direct: 0.2 mg/dL (ref 0.0–0.3)
Total Bilirubin: 0.6 mg/dL (ref 0.2–1.2)
Total Protein: 7.7 g/dL (ref 6.0–8.3)

## 2021-11-01 ENCOUNTER — Ambulatory Visit (HOSPITAL_COMMUNITY): Payer: 59

## 2022-11-30 IMAGING — MR MR ABDOMEN WO/W CM MRCP
19 of 22 series · 44 of 48 positions shown · IV contrast (gadavist)
Comparison: CT on 09/13/2021

CLINICAL DATA: Elevated liver function tests. Recent pancreatitis
and choledocholithiasis, with ERCP approximately 1 month ago.

EXAM:
MRI ABDOMEN WITHOUT AND WITH CONTRAST (INCLUDING MRCP)
TECHNIQUE: Multiplanar multisequence MR imaging of the abdomen was performed
both before and after the administration of intravenous contrast.
Heavily T2-weighted images of the biliary and pancreatic ducts were
obtained, and three-dimensional MRCP images were rendered by post
processing.
CONTRAST:  7mL GADAVIST GADOBUTROL 1 MMOL/ML IV SOLN

[Series 4: DWI · axial · 6.0mm · 1.49mm/px · z∈[-71,+181]mm · 2 of 72 slices shown (1 of 2)]
[im 1/72]
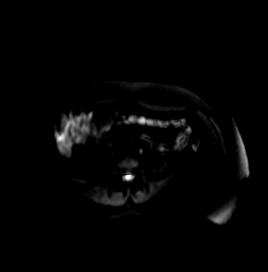
[im 72/72]
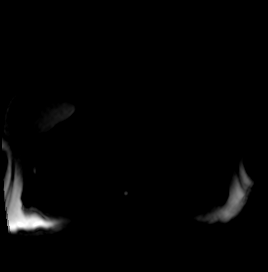

[Series 5: DWI · axial · 6.0mm · 1.49mm/px · 1 of 36 slices shown (2 of 2)]
[im 1/36]
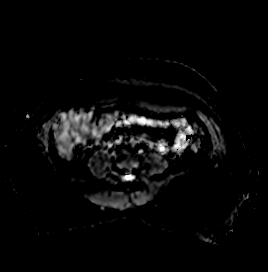

[Series 6: cor_3d_spc_trig-resp · 1 of 7 slices shown]
[im 1/7]
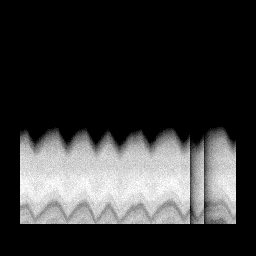

[Series 7: cor_3d_spc_trig · coronal · 1.0mm · 0.49mm/px · 2 of 72 slices shown]
[im 1/72]
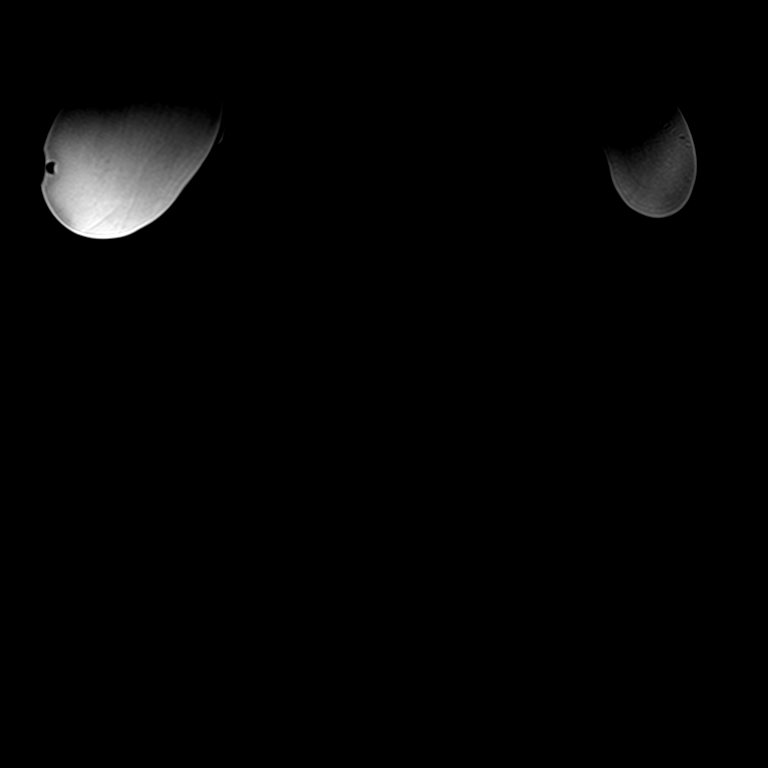
[im 72/72]
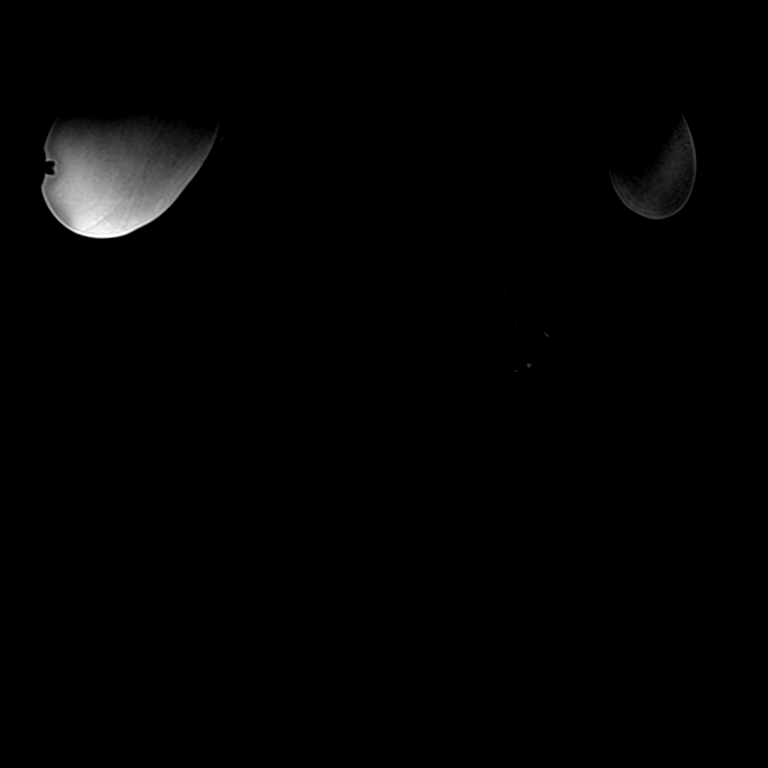

[Series 9: T2 fat-sat · axial · 6.0mm · 1.25mm/px · 1 of 30 slices shown]
[im 1/30]
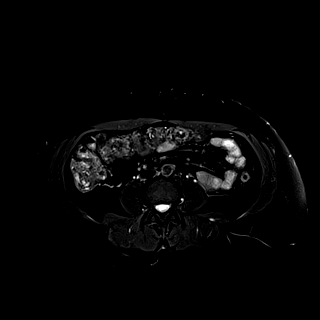

[Series 12: T2 · coronal · 6.0mm · 1.48mm/px · 1 of 30 slices shown (1 of 2)]
[im 1/30]
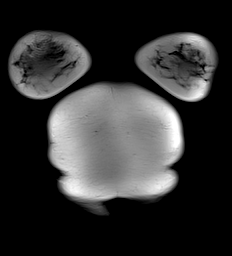

[Series 14: T1 · axial · 3.0mm · 1.25mm/px · z∈[-51,+162]mm · 3 of 72 slices shown (1 of 2)]
[im 1/72]
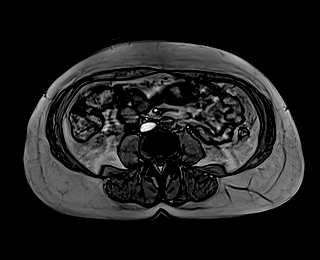
[im 36/72]
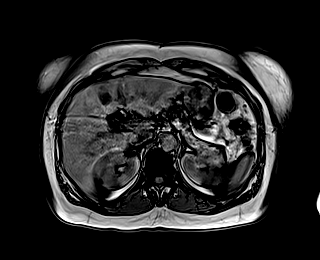
[im 72/72]
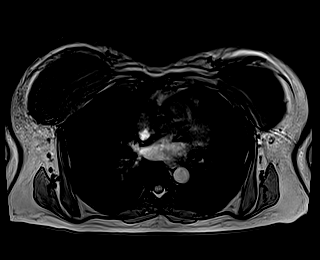

[Series 15: T1 · axial · 3.0mm · 1.25mm/px · z∈[-51,+162]mm · 3 of 72 slices shown (2 of 2)]
[im 1/72]
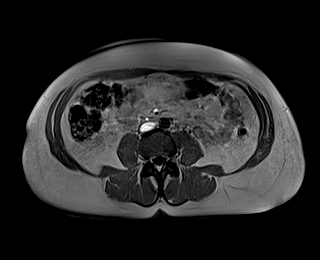
[im 36/72]
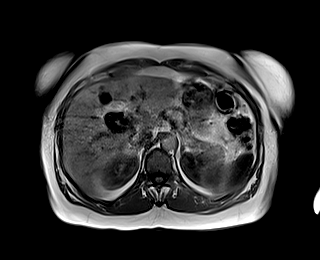
[im 72/72]
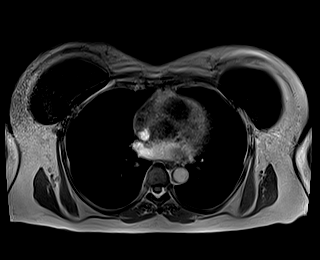

[Series 16: T2 · axial · 6.0mm · 1.56mm/px · 1 of 30 slices shown (2 of 2)]
[im 1/30]
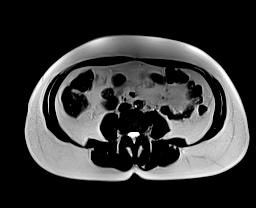

[Series 18: T1 dynamic · axial · 3.0mm · 1.25mm/px · z∈[-72,+165]mm · 3 of 80 slices shown (1 of 10)]
[im 1/80]
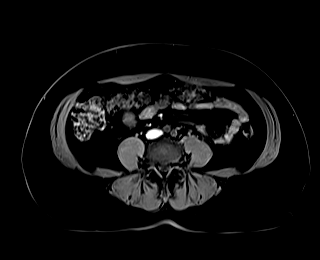
[im 40/80]
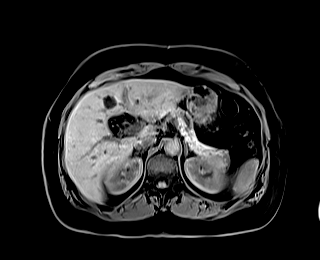
[im 80/80]
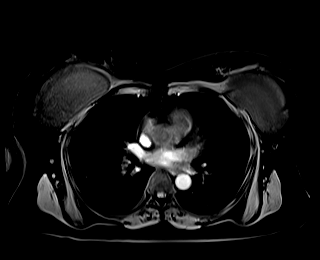

[Series 23: T1 dynamic · axial · 3.0mm · 1.25mm/px · z∈[-72,+165]mm · 3 of 80 slices shown (2 of 10)]
[im 1/80]
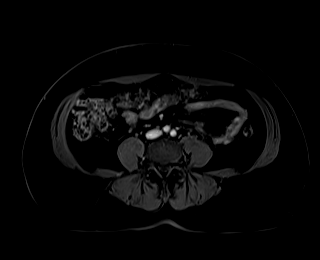
[im 40/80]
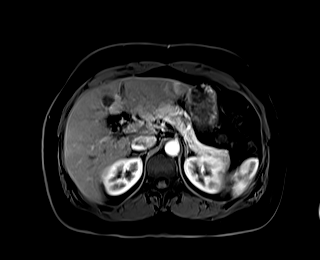
[im 80/80]
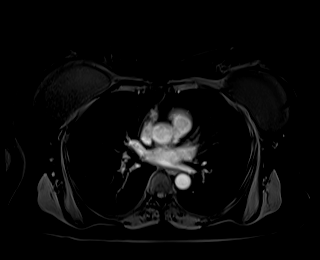

[Series 24: T1 dynamic · axial · 3.0mm · 1.25mm/px · z∈[-72,+165]mm · 3 of 80 slices shown (3 of 10)]
[im 1/80]
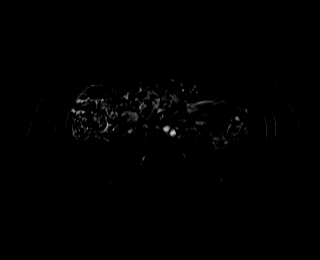
[im 40/80]
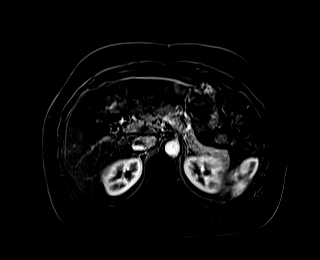
[im 80/80]
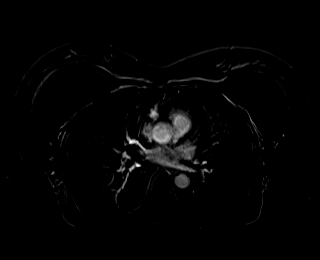

[Series 27: T1 dynamic · axial · 3.0mm · 1.25mm/px · z∈[-72,+165]mm · 3 of 80 slices shown (4 of 10)]
[im 1/80]
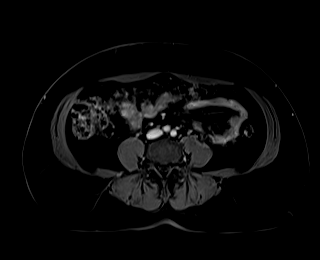
[im 40/80]
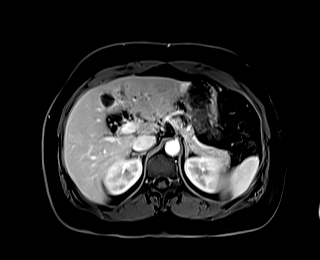
[im 80/80]
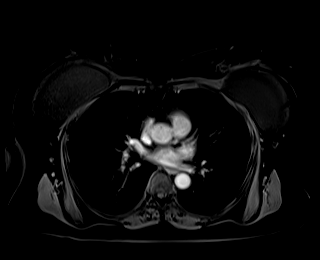

[Series 28: T1 dynamic · axial · 3.0mm · 1.25mm/px · z∈[-72,+165]mm · 3 of 80 slices shown (5 of 10)]
[im 1/80]
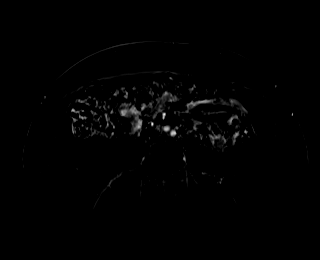
[im 40/80]
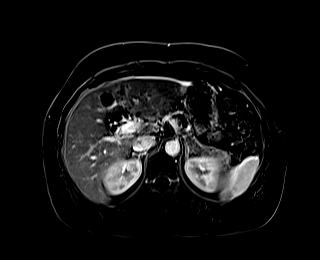
[im 80/80]
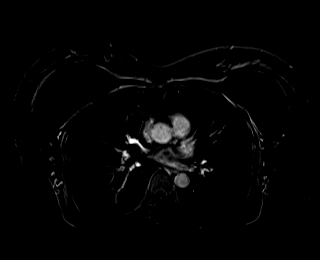

[Series 31: T1 dynamic · axial · 3.0mm · 1.25mm/px · z∈[-72,+165]mm · 3 of 80 slices shown (6 of 10)]
[im 1/80]
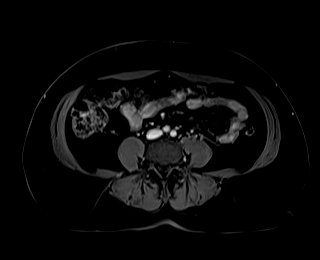
[im 40/80]
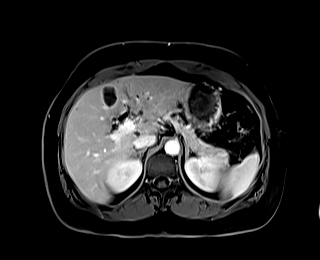
[im 80/80]
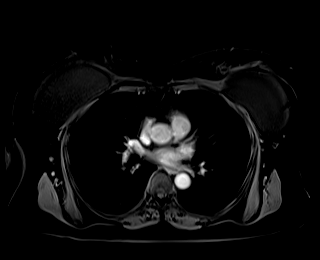

[Series 32: T1 dynamic · axial · 3.0mm · 1.25mm/px · z∈[-72,+165]mm · 3 of 80 slices shown (7 of 10)]
[im 1/80]
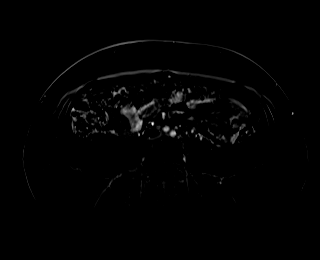
[im 40/80]
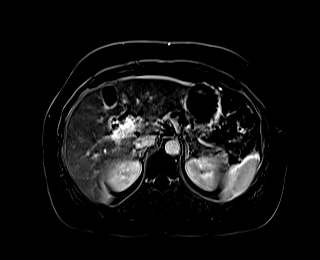
[im 80/80]
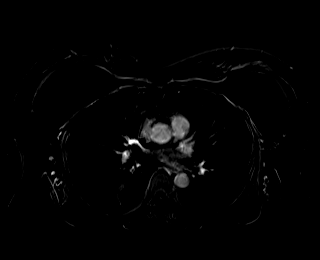

[Series 34: T1 dynamic · coronal · 3.0mm · 1.41mm/px · 3 of 72 slices shown (8 of 10)]
[im 1/72]
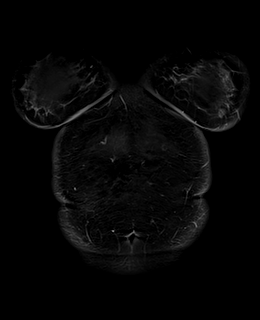
[im 36/72]
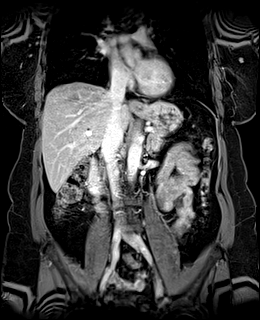
[im 72/72]
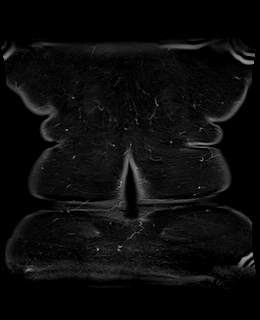

[Series 37: T1 dynamic · axial · 3.0mm · 1.25mm/px · z∈[-72,+165]mm · 3 of 80 slices shown (9 of 10)]
[im 1/80]
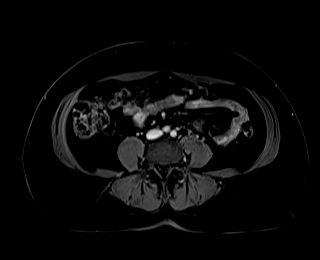
[im 40/80]
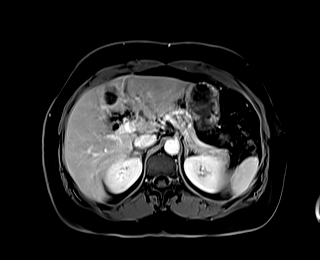
[im 80/80]
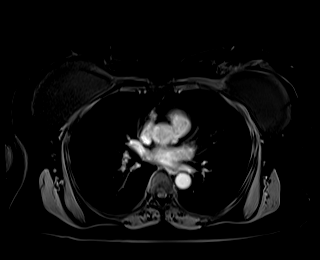

[Series 38: T1 dynamic · axial · 3.0mm · 1.25mm/px · z∈[-72,+45]mm · 2 of 80 slices shown (10 of 10)]
[im 1/80]
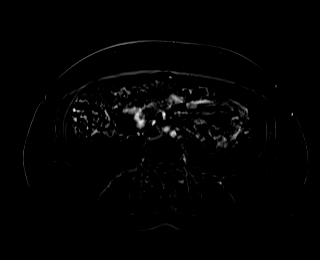
[im 40/80]
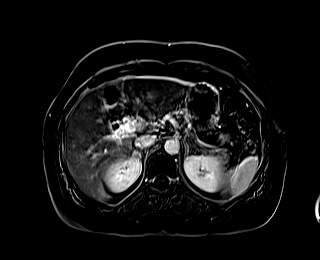

[44 of 48 positions shown; findings below may reference images not displayed]

FINDINGS: Lower chest: No acute findings.

Hepatobiliary: Multiple small benign-appearing hepatic cysts are
again seen, mainly in the left hepatic lobe, largest measuring
cm. No hepatic masses are identified.

Prior cholecystectomy. Mild diffuse biliary ductal dilatation is
seen but significantly decreased since previous study. Probable
pneumobilia in the central intrahepatic and common hepatic ducts. No
obstructing calculi are seen within the common bile duct.

Pancreas: No evidence of pancreatic mass or pancreatitis. No
evidence of pancreatic ductal dilatation or pancreas divisum.

Spleen:  Within normal limits in size and appearance.

Adrenals/Urinary Tract: No masses identified. No evidence of
hydronephrosis.

Stomach/Bowel: Unremarkable.

Vascular/Lymphatic: No pathologically enlarged lymph nodes
identified. No acute vascular findings.

Other:  None.

Musculoskeletal:  No suspicious bone lesions identified.
IMPRESSION: Mild diffuse biliary ductal dilatation has significantly decreased
since previous study. Probable pneumobilia in the central
intrahepatic and common hepatic ducts. No definite evidence of CBD
calculi or obstruction.

Stable small benign-appearing hepatic cysts.
